# Patient Record
Sex: Female | Born: 1980 | Race: Black or African American | Hispanic: No | Marital: Married | Smoking: Current every day smoker
Health system: Southern US, Community
[De-identification: ages and names within clinical notes are randomized; demographics above are authoritative.]

## PROBLEM LIST (undated history)

## (undated) DIAGNOSIS — J45909 Unspecified asthma, uncomplicated: Secondary | ICD-10-CM

---

## 2019-12-26 ENCOUNTER — Emergency Department (HOSPITAL_COMMUNITY)
Admission: EM | Admit: 2019-12-26 | Discharge: 2019-12-26 | Disposition: A | Discharge status: Home / Self Care | Payer: Medicaid Other | Attending: Emergency Medicine | Admitting: Emergency Medicine

## 2019-12-26 ENCOUNTER — Other Ambulatory Visit: Payer: Self-pay

## 2019-12-26 ENCOUNTER — Emergency Department (HOSPITAL_COMMUNITY): Payer: Medicaid Other

## 2019-12-26 ENCOUNTER — Encounter (HOSPITAL_COMMUNITY): Payer: Self-pay

## 2019-12-26 DIAGNOSIS — W010XXA Fall on same level from slipping, tripping and stumbling without subsequent striking against object, initial encounter: Secondary | ICD-10-CM | POA: Insufficient documentation

## 2019-12-26 DIAGNOSIS — Y9301 Activity, walking, marching and hiking: Secondary | ICD-10-CM | POA: Insufficient documentation

## 2019-12-26 DIAGNOSIS — M25561 Pain in right knee: Secondary | ICD-10-CM | POA: Insufficient documentation

## 2019-12-26 MED ORDER — KETOROLAC TROMETHAMINE 60 MG/2ML IM SOLN
60.0000 mg | Freq: Once | INTRAMUSCULAR | Status: AC
  Administered 2019-12-26: 60 mg via INTRAMUSCULAR
  Filled 2019-12-26: qty 2

## 2019-12-26 NOTE — ED Triage Notes (Signed)
Pt arrived via walk in, c/o right knee pain. Slip and fall onto kitchen floor last night. Denies any other injury.

## 2019-12-26 NOTE — Progress Notes (Signed)
Orthopedic Tech Progress Note Patient Details:  Erika Thomas 1980/05/01 [id number]  Ortho Devices Type of Ortho Device: Knee Sleeve Ortho Device/Splint Interventions: Application   Post Interventions Patient Tolerated: Well Instructions Provided: Care of device   Saul Fordyce 12/26/2019, 9:50 AM

## 2019-12-26 NOTE — ED Provider Notes (Signed)
[facility]-EMERGENCY DEPT Provider Note   CSN: [id number] Arrival date & time: 12/26/19  5366     History Chief Complaint  Patient presents with  . Knee Pain    Erika Thomas is a 39 y.o. female.  HPI 39 year old female with no significant medical history presents to the ER with complaints of right knee pain after a fall.  Patient states that yesterday evening she was walking from one room to another and tripped over a rug and fell onto her right knee.  She states she was able to get up recently, but then slowly developed pain in the right knee.  States that she woke up around 3 AM and has not been able to sleep due to the pain.  States she also feels some pain behind her knee as well.  Denies any numbness or tingling.  Has been able to ambulate but with difficulty.  She has not taken anything for pain management.    History reviewed. No pertinent past medical history.  There are no problems to display for this patient.   History reviewed. No pertinent surgical history.   OB History   No obstetric history on file.     History reviewed. No pertinent family history.  Social History   Tobacco Use  . Smoking status: Never Smoker  . Smokeless tobacco: Never Used  Substance Use Topics  . Alcohol use: Not on file  . Drug use: Not on file    Home Medications Prior to Admission medications   Not on File    Allergies    Patient has no known allergies.  Review of Systems   Review of Systems  Musculoskeletal: Positive for arthralgias and joint swelling.  Neurological: Negative for tremors, weakness and numbness.    Physical Exam Updated Vital Signs BP (!) 119/94 (BP Location: Right Arm)   Pulse 80   Temp 97.7 F (36.5 C) (Oral)   Resp 18   Ht 5\' 5"  (1.651 m)   Wt 81.6 kg   LMP 12/26/2019 (Exact Date)   SpO2 100%   BMI 29.95 kg/m   Physical Exam Vitals and nursing note reviewed.  Constitutional:      General: She is not in acute  distress.    Appearance: She is well-developed.  HENT:     Head: Normocephalic and atraumatic.  Eyes:     Conjunctiva/sclera: Conjunctivae normal.  Cardiovascular:     Rate and Rhythm: Normal rate and regular rhythm.     Heart sounds: No murmur heard.   Pulmonary:     Effort: Pulmonary effort is normal. No respiratory distress.     Breath sounds: Normal breath sounds.  Abdominal:     Palpations: Abdomen is soft.     Tenderness: There is no abdominal tenderness.  Musculoskeletal:        General: Tenderness and signs of injury present. No swelling or deformity.     Cervical back: Neck supple.     Right lower leg: No edema.     Comments: Right circular erythema with visible abrasion to the right tibial tuberosity with tenderness to palpation to the area.  No tibial plateau tenderness.  Full range of motion of right knee.  4/5 strength in flexion of the knee secondary to pain, 5/5 strength with extension.  No joint laxity noted, negative Lachman's.  No notable deformities or significant swelling noted  Skin:    General: Skin is warm and dry.  Neurological:     General: No focal  deficit present.     Mental Status: She is alert and oriented to person, place, and time.     Motor: No weakness.  Psychiatric:        Mood and Affect: Mood normal.        Behavior: Behavior normal.     ED Results / Procedures / Treatments   Labs (all labs ordered are listed, but only abnormal results are displayed) Labs Reviewed - No data to display  EKG None  Radiology DG Knee Complete 4 Views Right  Result Date: 12/26/2019 CLINICAL DATA:  Fall.  Knee pain EXAM: RIGHT KNEE - COMPLETE 4+ VIEW COMPARISON:  None. FINDINGS: No evidence of fracture, dislocation, or joint effusion. No evidence of arthropathy or other focal bone abnormality. Soft tissues are unremarkable. IMPRESSION: No acute abnormality. Electronically Signed   By: Maisie Fus  Register   On: 12/26/2019 09:20    Procedures Procedures  (including critical care time)  Medications Ordered in ED Medications  ketorolac (TORADOL) injection 60 mg (has no administration in time range)    ED Course  I have reviewed the triage vital signs and the nursing notes.  Pertinent labs & imaging results that were available during my care of the patient were reviewed by me and considered in my medical decision making (see chart for details).    MDM Rules/Calculators/A&P                         39 year old female with knee pain after fall.  Patient with a superficial abrasion and erythema to the tibial tuberosity w/ tenderness, no tibial plateau tenderness, low concern for tibial plateau fracture.  Negative Lachman's, no noted joint laxity.  Plain films with  no evidence of fractures.  Patient was informed of the encouraging findings, encouraged her to take Tylenol or ibuprofen for pain which I provided.  Knee sleeve provided.  Encouraged RICE.  Return precautions discussed. PCP followup if no improvement, directed to phone number on discharge paperwork in order to establish with one.  She voiced understanding and is agreeable. Stable for discharge    Final Clinical Impression(s) / ED Diagnoses Final diagnoses:  Acute pain of right knee    Rx / DC Orders ED Discharge Orders    None       Leone Brand 12/26/19 0931    Lorre Nick, MD 12/27/19 [phone]

## 2019-12-26 NOTE — Discharge Instructions (Signed)
Thank you for letting me take care of you in the ER today  Your x-rays did not show any evidence of broken bones in your knee.  It is likely that you have a bone bruise or local irritation due to your fall.  Please make sure to ice area knee or use heat depending on what helps you, wear the knee sleeve, take Tylenol or ibuprofen for pain.  If your symptoms do not improve, please make sure to follow-up with a primary care doctor, if you do not have a primary care doctor, please make sure to call the phone number in your discharge paperwork in order to establish with 1.  Return to the ER for any worsening symptoms.

## 2020-04-28 ENCOUNTER — Ambulatory Visit (HOSPITAL_COMMUNITY): Payer: Self-pay

## 2020-08-02 ENCOUNTER — Emergency Department (HOSPITAL_COMMUNITY): Payer: Self-pay

## 2020-08-02 ENCOUNTER — Other Ambulatory Visit: Payer: Self-pay

## 2020-08-02 ENCOUNTER — Emergency Department (HOSPITAL_COMMUNITY)
Admission: EM | Admit: 2020-08-02 | Discharge: 2020-08-02 | Disposition: A | Discharge status: Home / Self Care | Payer: Self-pay | Attending: Emergency Medicine | Admitting: Emergency Medicine

## 2020-08-02 ENCOUNTER — Encounter (HOSPITAL_COMMUNITY): Payer: Self-pay

## 2020-08-02 DIAGNOSIS — R0782 Intercostal pain: Secondary | ICD-10-CM | POA: Insufficient documentation

## 2020-08-02 DIAGNOSIS — A599 Trichomoniasis, unspecified: Secondary | ICD-10-CM | POA: Insufficient documentation

## 2020-08-02 DIAGNOSIS — R0789 Other chest pain: Secondary | ICD-10-CM

## 2020-08-02 LAB — COMPREHENSIVE METABOLIC PANEL
ALT: 18 U/L (ref 0–44)
AST: 22 U/L (ref 15–41)
Albumin: 4.1 g/dL (ref 3.5–5.0)
Alkaline Phosphatase: 61 U/L (ref 38–126)
Anion gap: 11 (ref 5–15)
BUN: 11 mg/dL (ref 6–20)
CO2: 20 mmol/L — ABNORMAL LOW (ref 22–32)
Calcium: 9.2 mg/dL (ref 8.9–10.3)
Chloride: 106 mmol/L (ref 98–111)
Creatinine, Ser: 0.93 mg/dL (ref 0.44–1.00)
GFR, Estimated: >60 mL/min (ref >60)
Glucose, Bld: 98 mg/dL (ref 70–99)
Potassium: 3.7 mmol/L (ref 3.5–5.1)
Sodium: 137 mmol/L (ref 135–145)
Total Bilirubin: 0.5 mg/dL (ref 0.3–1.2)
Total Protein: 7.6 g/dL (ref 6.5–8.1)

## 2020-08-02 LAB — URINALYSIS, ROUTINE W REFLEX MICROSCOPIC
Bacteria, UA: NONE SEEN
Bilirubin Urine: NEGATIVE
Glucose, UA: NEGATIVE mg/dL
Hgb urine dipstick: NEGATIVE
Ketones, ur: 20 mg/dL — AB
Nitrite: NEGATIVE
Protein, ur: 30 mg/dL — AB
Specific Gravity, Urine: 1.024 (ref 1.005–1.030)
pH: 5 (ref 5.0–8.0)

## 2020-08-02 LAB — CBC
HCT: 36.4 % (ref 36.0–46.0)
Hemoglobin: 11.6 g/dL — ABNORMAL LOW (ref 12.0–15.0)
MCH: 21.4 pg — ABNORMAL LOW (ref 26.0–34.0)
MCHC: 31.9 g/dL (ref 30.0–36.0)
MCV: 67.3 fL — ABNORMAL LOW (ref 80.0–100.0)
Platelets: 428 10*3/uL — ABNORMAL HIGH (ref 150–400)
RBC: 5.41 MIL/uL — ABNORMAL HIGH (ref 3.87–5.11)
RDW: 19.9 % — ABNORMAL HIGH (ref 11.5–15.5)
WBC: 8.6 10*3/uL (ref 4.0–10.5)
nRBC: 0 % (ref 0.0–0.2)

## 2020-08-02 LAB — TROPONIN I (HIGH SENSITIVITY): Troponin I (High Sensitivity): 4 ng/L (ref <18)

## 2020-08-02 LAB — LIPASE, BLOOD: Lipase: 24 U/L (ref 11–51)

## 2020-08-02 LAB — HCG, QUANTITATIVE, PREGNANCY: hCG, Beta Chain, Quant, S: <1 m[IU]/mL (ref <5)

## 2020-08-02 MED ORDER — METHOCARBAMOL 500 MG PO TABS: 500.0000 mg | ORAL_TABLET | Freq: Two times a day (BID) | ORAL | 0 refills | Status: DC

## 2020-08-02 MED ORDER — ACETAMINOPHEN 500 MG PO TABS
1000.0000 mg | ORAL_TABLET | Freq: Once | ORAL | Status: AC
  Administered 2020-08-02: 1000 mg via ORAL
  Filled 2020-08-02: qty 2

## 2020-08-02 MED ORDER — TINIDAZOLE 500 MG PO TABS: 2.0000 g | ORAL_TABLET | Freq: Every day | ORAL | 0 refills | Status: AC

## 2020-08-02 NOTE — ED Notes (Signed)
Pt c/o headache during v/s update and is requesting tylenol. Triage RN notified. Pt was advised that she would have to wait until she sees a doctor.

## 2020-08-02 NOTE — Discharge Instructions (Addendum)
Please take your printed prescriptions to the pharmacy of your choosing. Take the muscle relaxer at nighttime to help with your chest wall pain. DO NOT DRIVE WHILE ON THE MUSCLE RELAXER AS IT CAN MAKE YOU DROWSY. You can take antiinflammatories during the day for same. You can also apply OTC Voltaren gel to your ribs to help with pain. Smoking cessation will also benefit you.    You will need to have all partners tested and treated for trichomonas as well.   Please follow up with [facility] for primary care needs.   Return to the ED for any worsening symptoms

## 2020-08-02 NOTE — ED Notes (Signed)
Pt VS updated and directed to waiting room until room call. Pt sts she is unable to wait any longer, has not slept since yesterday, and has to work. Eloped from waiting area.

## 2020-08-02 NOTE — ED Provider Notes (Signed)
Emergency Medicine Provider Triage Evaluation Note  Erika Thomas , a 40 y.o. female  was evaluated in triage.  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 Pt is a current every day smoker. No nausea, vomiting, diarrhea.  Review of Systems  Positive: + left lateral chest pain Negative: - nausea, vomiting, diarrhea  Physical Exam  BP (!) 130/93 (BP Location: Right Arm)   Pulse 92   Temp 98.3 F (36.8 C) (Oral)   Resp 20   SpO2 100%  Gen:   Awake, no distress   Resp:  Normal effort  MSK:   Moves extremities without difficulty. + bilateral rib TTP Other:    Medical Decision Making  Medically screening exam initiated at 8:34 AM.  Appropriate orders placed.  Erika Thomas was informed that the remainder of the evaluation will be completed by another provider, this initial triage assessment does not replace that evaluation, and the importance of remaining in the ED until their evaluation is complete.     Tanda Rockers, PA-C 08/02/20 5625    Mancel Bale, MD 08/02/20 [phone]

## 2020-08-02 NOTE — ED Provider Notes (Signed)
[facility]-EMERGENCY DEPT Provider Note   CSN: [id number] Arrival date & time: 08/02/20  0046     History Chief Complaint  Patient presents with  . Abdominal Pain    Erika MaserLatonya Menna is a 40 y.o. female who presents to the ED today with complaints of intermittent left sided chest wall pain for the past 6 years.  She reports she is a current every day smoker.  She states that sometimes she will cough and feel a swelling in the left side of her rib cage that is painful.  She states that she has been dealing with it on and off for the past 6 years however yesterday the pain radiated into her right side causing concern.  She takes Tylenol as needed for pain with mild relief.  She has never been evaluated for same.  She denies any abdominal pain, nausea, vomiting, diarrhea, shortness of breath, diaphoresis, leg swelling, any other associated symptoms.  She denies any history of DVT/PE.  No exogenous hormone use.  No recent prolonged travel or immobilization.  No hemoptysis.  No active malignancy.   She would also like to discuss treatment for trichomonas and bacterial vaginosis.  She states that she was diagnosed with this at the [facility] several months ago however was told that she cannot drink while on Flagyl.  She states that she drinks about 2 beers daily to help her sleep.  She never took anything for the trichomonas or BV and continues to have discharge.  She denies any pelvic pain.   The history is provided by the patient and medical records.       History reviewed. No pertinent past medical history.  There are no problems to display for this patient.   History reviewed. No pertinent surgical history.   OB History   No obstetric history on file.     No family history on file.  Social History   Tobacco Use  . Smoking status: Never Smoker  . Smokeless tobacco: Never Used    Home Medications Prior to Admission medications   Medication Sig Start  Date End Date Taking? Authorizing Provider  methocarbamol (ROBAXIN) 500 MG tablet Take 1 tablet (500 mg total) by mouth 2 (two) times daily. 08/02/20  Yes Mihaela Fajardo, PA-C  tinidazole (TINDAMAX) 500 MG tablet Take 4 tablets (2,000 mg total) by mouth daily with breakfast for 7 days. 08/02/20 08/09/20 Yes Artyom Stencel, PA-C    Allergies    Patient has no known allergies.  Review of Systems   Review of Systems  Constitutional: Negative for chills and fever.  Respiratory: Positive for cough (chronic). Negative for shortness of breath.   Cardiovascular: Positive for chest pain. Negative for palpitations and leg swelling.  Gastrointestinal: Negative for abdominal pain, diarrhea, nausea and vomiting.  Genitourinary: Positive for vaginal discharge. Negative for dysuria and vaginal bleeding.  All other systems reviewed and are negative.   Physical Exam Updated Vital Signs BP (!) 130/93 (BP Location: Right Arm)   Pulse 92   Temp 98.3 F (36.8 C) (Oral)   Resp 20   SpO2 100%   Physical Exam Vitals and nursing note reviewed.  Constitutional:      Appearance: She is not ill-appearing or diaphoretic.  HENT:     Head: Normocephalic and atraumatic.  Eyes:     Conjunctiva/sclera: Conjunctivae normal.  Cardiovascular:     Rate and Rhythm: Normal rate and regular rhythm.  Pulmonary:     Effort: Pulmonary effort is normal.  Breath sounds: Normal breath sounds. No wheezing, rhonchi or rales.     Comments: Bilateral rib pain Chest:     Chest wall: Tenderness present.  Abdominal:     General: Abdomen is flat.     Palpations: Abdomen is soft.     Tenderness: There is no abdominal tenderness. There is no guarding or rebound.  Genitourinary:    Comments: Deferred Musculoskeletal:     Cervical back: Neck supple.  Skin:    General: Skin is warm and dry.  Neurological:     Mental Status: She is alert.     ED Results / Procedures / Treatments   Labs (all labs ordered are listed,  but only abnormal results are displayed) Labs Reviewed  COMPREHENSIVE METABOLIC PANEL - Abnormal; Notable for the following components:      Result Value   CO2 20 (*)    All other components within normal limits  CBC - Abnormal; Notable for the following components:   RBC 5.41 (*)    Hemoglobin 11.6 (*)    MCV 67.3 (*)    MCH 21.4 (*)    RDW 19.9 (*)    Platelets 428 (*)    All other components within normal limits  URINALYSIS, ROUTINE W REFLEX MICROSCOPIC - Abnormal; Notable for the following components:   Color, Urine AMBER (*)    APPearance CLOUDY (*)    Ketones, ur 20 (*)    Protein, ur 30 (*)    Leukocytes,Ua MODERATE (*)    All other components within normal limits  LIPASE, BLOOD  HCG, QUANTITATIVE, PREGNANCY  TROPONIN I (HIGH SENSITIVITY)    EKG EKG Interpretation  Date/Time:  Thursday Aug 02 2020 09:21:08 EDT Ventricular Rate:  105 PR Interval:  149 QRS Duration: 83 QT Interval:  318 QTC Calculation: 421 R Axis:   78 Text Interpretation: Sinus tachycardia Right atrial enlargement Borderline T wave abnormalities Baseline wander in lead(s) V4 V6 No old tracing to compare Confirmed by Mancel Bale [phone]) on 08/02/2020 9:44:04 AM   Radiology DG Chest 2 View  Result Date: 08/02/2020 CLINICAL DATA:  Chest pain EXAM: CHEST - 2 VIEW COMPARISON:  None. FINDINGS: Lungs are clear. Heart size and pulmonary vascularity are normal. No adenopathy. No pneumothorax. No bone lesions. Spina bifida occulta noted at T1. IMPRESSION: Lungs clear.  Heart size normal. Electronically Signed   By: Bretta Bang III M.D.   On: 08/02/2020 08:51    Procedures Procedures   Medications Ordered in ED Medications  acetaminophen (TYLENOL) tablet 1,000 mg (1,000 mg Oral Given 08/02/20 1109)    ED Course  I have reviewed the triage vital signs and the nursing notes.  Pertinent labs & imaging results that were available during my care of the patient were reviewed by me and considered in  my medical decision making (see chart for details).    MDM Rules/Calculators/A&P                          40 year old female who presents to the ED today with intermittent left lateral chest wall pain for several years, radiated into her right chest wall yesterday prompting concern.  On arrival to the ED patient is afebrile, nontachycardic, satting 100% on room air.  She was mildly tachycardic initially at 107 however this dissipated while she was in the waiting room.  She had lab work done while in the waiting room including a CBC, CMP, lipase without any acute abnormalities.  Patient's  pain is not located in her abdomen, it is in her right lateral chest.  She is an active every day smoker, states that sometimes she will cough and then feel this pain and bulge in her left side.  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  Symptoms sound very atypical for ACS at this time however given complaint of chest pain will add on a Trope.  She has no risk factors concerning for PE at this time.  If troponin negative and chest x-ray and EKG are unremarkable we will plan to discharge home with outpatient follow-up.  Patient instructed on smoking cessation as I feel this is likely irritating symptoms.  Symptoms consistent with musculoskeletal type pain.   EKG was taken initially on arrival with sinus tachycardia however again this is resolved.  Does have some T wave abnormalities.  Chest x-ray clear. Troponin of 4.  Given her symptoms have been ongoing quite some time I do not feel she needs a repeat troponin.  Again suspect musculoskeletal type pain.  She did aggravate her chest wall lifting yesterday, will prescribe muscle relaxer in the outpatient setting.  On reevaluation patient is requesting medication for trichomonas and bacterial vaginosis.  She states she was diagnosed with that a couple of months ago however never completed the course as she drinks daily.  She is  not interested in quitting drinking.  Discussed case with attending physician Dr. Effie Shy, it does appear that it is recommended that patient refrain from alcohol use with tinnitus I will however disulfiram like reaction lower than Flagyl.  Will discharge home with same at this time  Had planned on initially loading dose of 10 Antizol here in the ED for a one-time treatment of trichomonas however we do not have it in stock.  Will discharge with same.   Urinalysis with moderate leuks, 11-20 white blood cells however no bacteria seen. Negative nitrites.  No urinary symptoms.  No flank tenderness palpation.  Overall reassuring.  Patient to be discharged home at this time.  Provide outpatient follow-up with [facility] for further evaluation.  Will provide muscle relaxer to take at nighttime.  Patient instructed to pick up her medication for trichomonas and have her partner tested and treated as well.  GU exam deferred at this time.   This note was prepared using Dragon voice recognition software and may include unintentional dictation errors due to the inherent limitations of voice recognition software.  Final Clinical Impression(s) / ED Diagnoses Final diagnoses:  Chest wall pain  Trichomonas infection    Rx / DC Orders ED Discharge Orders         Ordered    methocarbamol (ROBAXIN) 500 MG tablet  2 times daily        08/02/20 1254    tinidazole (TINDAMAX) 500 MG tablet  Daily with breakfast        08/02/20 1254           Discharge Instructions     Please take your printed prescriptions to the pharmacy of your choosing. Take the muscle relaxer at nighttime to help with your chest wall pain. DO NOT DRIVE WHILE ON THE MUSCLE RELAXER AS IT CAN MAKE YOU DROWSY. You can take antiinflammatories during the day for same. You can also apply OTC Voltaren gel to your ribs to help with pain. Smoking cessation will also benefit you.    You will need to have all partners tested  and treated  for trichomonas as well.   Please follow up with [facility] for primary care needs.   Return to the ED for any worsening symptoms       Tanda Rockers, PA-C 08/02/20 1257    Mancel Bale, MD 08/02/20 [phone]

## 2020-08-02 NOTE — ED Triage Notes (Signed)
Pt reports bilateral mid side pain. Pt describes it as a "bulging and rubber band feeling that squeezes" when present.

## 2021-01-19 ENCOUNTER — Emergency Department (HOSPITAL_COMMUNITY)
Admission: EM | Admit: 2021-01-19 | Discharge: 2021-01-20 | Discharge status: Against Medical Advice | Payer: Medicaid Other

## 2021-01-19 ENCOUNTER — Other Ambulatory Visit: Payer: Self-pay

## 2021-01-20 ENCOUNTER — Inpatient Hospital Stay (HOSPITAL_COMMUNITY)
Admission: EM | Admit: 2021-01-20 | Discharge: 2021-01-25 | DRG: 193 | Disposition: A | Discharge status: Home / Self Care | Payer: Self-pay | Attending: Family Medicine | Admitting: Family Medicine

## 2021-01-20 ENCOUNTER — Emergency Department (HOSPITAL_COMMUNITY): Payer: Self-pay

## 2021-01-20 ENCOUNTER — Encounter (HOSPITAL_COMMUNITY): Payer: Self-pay | Admitting: Emergency Medicine

## 2021-01-20 DIAGNOSIS — J45901 Unspecified asthma with (acute) exacerbation: Secondary | ICD-10-CM | POA: Diagnosis present

## 2021-01-20 DIAGNOSIS — J1000 Influenza due to other identified influenza virus with unspecified type of pneumonia: Principal | ICD-10-CM | POA: Diagnosis present

## 2021-01-20 DIAGNOSIS — J9621 Acute and chronic respiratory failure with hypoxia: Secondary | ICD-10-CM | POA: Diagnosis present

## 2021-01-20 DIAGNOSIS — T380X5A Adverse effect of glucocorticoids and synthetic analogues, initial encounter: Secondary | ICD-10-CM | POA: Diagnosis present

## 2021-01-20 DIAGNOSIS — J440 Chronic obstructive pulmonary disease with acute lower respiratory infection: Secondary | ICD-10-CM | POA: Diagnosis present

## 2021-01-20 DIAGNOSIS — D509 Iron deficiency anemia, unspecified: Secondary | ICD-10-CM | POA: Diagnosis present

## 2021-01-20 DIAGNOSIS — J101 Influenza due to other identified influenza virus with other respiratory manifestations: Secondary | ICD-10-CM | POA: Diagnosis present

## 2021-01-20 DIAGNOSIS — G9341 Metabolic encephalopathy: Secondary | ICD-10-CM | POA: Diagnosis present

## 2021-01-20 DIAGNOSIS — E871 Hypo-osmolality and hyponatremia: Secondary | ICD-10-CM | POA: Diagnosis present

## 2021-01-20 DIAGNOSIS — J9622 Acute and chronic respiratory failure with hypercapnia: Secondary | ICD-10-CM | POA: Diagnosis present

## 2021-01-20 DIAGNOSIS — J9601 Acute respiratory failure with hypoxia: Secondary | ICD-10-CM

## 2021-01-20 DIAGNOSIS — F17200 Nicotine dependence, unspecified, uncomplicated: Secondary | ICD-10-CM | POA: Diagnosis present

## 2021-01-20 DIAGNOSIS — Z560 Unemployment, unspecified: Secondary | ICD-10-CM

## 2021-01-20 DIAGNOSIS — J111 Influenza due to unidentified influenza virus with other respiratory manifestations: Secondary | ICD-10-CM

## 2021-01-20 DIAGNOSIS — R0602 Shortness of breath: Secondary | ICD-10-CM

## 2021-01-20 DIAGNOSIS — E876 Hypokalemia: Secondary | ICD-10-CM | POA: Diagnosis present

## 2021-01-20 DIAGNOSIS — Z20822 Contact with and (suspected) exposure to covid-19: Secondary | ICD-10-CM | POA: Diagnosis present

## 2021-01-20 DIAGNOSIS — J9602 Acute respiratory failure with hypercapnia: Secondary | ICD-10-CM | POA: Diagnosis present

## 2021-01-20 DIAGNOSIS — E669 Obesity, unspecified: Secondary | ICD-10-CM | POA: Diagnosis present

## 2021-01-20 DIAGNOSIS — F419 Anxiety disorder, unspecified: Secondary | ICD-10-CM | POA: Diagnosis present

## 2021-01-20 DIAGNOSIS — F17210 Nicotine dependence, cigarettes, uncomplicated: Secondary | ICD-10-CM | POA: Diagnosis present

## 2021-01-20 DIAGNOSIS — R739 Hyperglycemia, unspecified: Secondary | ICD-10-CM | POA: Diagnosis present

## 2021-01-20 DIAGNOSIS — Z6830 Body mass index (BMI) 30.0-30.9, adult: Secondary | ICD-10-CM

## 2021-01-20 DIAGNOSIS — D72829 Elevated white blood cell count, unspecified: Secondary | ICD-10-CM | POA: Diagnosis present

## 2021-01-20 DIAGNOSIS — J441 Chronic obstructive pulmonary disease with (acute) exacerbation: Secondary | ICD-10-CM | POA: Diagnosis present

## 2021-01-20 HISTORY — DX: Unspecified asthma, uncomplicated: J45.909

## 2021-01-20 LAB — COMPREHENSIVE METABOLIC PANEL
ALT: 22 U/L (ref 0–44)
AST: 35 U/L (ref 15–41)
Albumin: 4.2 g/dL (ref 3.5–5.0)
Alkaline Phosphatase: 64 U/L (ref 38–126)
Anion gap: 9 (ref 5–15)
BUN: 7 mg/dL (ref 6–20)
CO2: 22 mmol/L (ref 22–32)
Calcium: 8.8 mg/dL — ABNORMAL LOW (ref 8.9–10.3)
Chloride: 102 mmol/L (ref 98–111)
Creatinine, Ser: 0.8 mg/dL (ref 0.44–1.00)
GFR, Estimated: >60 mL/min (ref >60)
Glucose, Bld: 147 mg/dL — ABNORMAL HIGH (ref 70–99)
Potassium: 3.7 mmol/L (ref 3.5–5.1)
Sodium: 133 mmol/L — ABNORMAL LOW (ref 135–145)
Total Bilirubin: 0.5 mg/dL (ref 0.3–1.2)
Total Protein: 8 g/dL (ref 6.5–8.1)

## 2021-01-20 LAB — MRSA NEXT GEN BY PCR, NASAL: MRSA by PCR Next Gen: DETECTED — AB

## 2021-01-20 LAB — CBC WITH DIFFERENTIAL/PLATELET
Abs Immature Granulocytes: 0.1 10*3/uL — ABNORMAL HIGH (ref 0.00–0.07)
Basophils Absolute: 0.1 10*3/uL (ref 0.0–0.1)
Basophils Relative: 0 %
Eosinophils Absolute: 0 10*3/uL (ref 0.0–0.5)
Eosinophils Relative: 0 %
HCT: 35.4 % — ABNORMAL LOW (ref 36.0–46.0)
Hemoglobin: 10.6 g/dL — ABNORMAL LOW (ref 12.0–15.0)
Immature Granulocytes: 1 %
Lymphocytes Relative: 3 %
Lymphs Abs: 0.4 10*3/uL — ABNORMAL LOW (ref 0.7–4.0)
MCH: 20.5 pg — ABNORMAL LOW (ref 26.0–34.0)
MCHC: 29.9 g/dL — ABNORMAL LOW (ref 30.0–36.0)
MCV: 68.3 fL — ABNORMAL LOW (ref 80.0–100.0)
Monocytes Absolute: 0.8 10*3/uL (ref 0.1–1.0)
Monocytes Relative: 6 %
Neutro Abs: 13.1 10*3/uL — ABNORMAL HIGH (ref 1.7–7.7)
Neutrophils Relative %: 90 %
Platelets: 293 10*3/uL (ref 150–400)
RBC: 5.18 MIL/uL — ABNORMAL HIGH (ref 3.87–5.11)
RDW: 18.5 % — ABNORMAL HIGH (ref 11.5–15.5)
WBC: 14.5 10*3/uL — ABNORMAL HIGH (ref 4.0–10.5)
nRBC: 0 % (ref 0.0–0.2)

## 2021-01-20 LAB — TROPONIN I (HIGH SENSITIVITY)
Troponin I (High Sensitivity): 3 ng/L (ref <18)
Troponin I (High Sensitivity): 4 ng/L (ref <18)

## 2021-01-20 LAB — I-STAT BETA HCG BLOOD, ED (MC, WL, AP ONLY): I-stat hCG, quantitative: <5 m[IU]/mL (ref <5)

## 2021-01-20 LAB — RESP PANEL BY RT-PCR (FLU A&B, COVID) ARPGX2
Influenza A by PCR: POSITIVE — AB
Influenza B by PCR: NEGATIVE
SARS Coronavirus 2 by RT PCR: NEGATIVE

## 2021-01-20 MED ORDER — LACTATED RINGERS IV SOLN: INTRAVENOUS | Status: AC

## 2021-01-20 MED ORDER — ENOXAPARIN SODIUM 40 MG/0.4ML IJ SOSY
40.0000 mg | PREFILLED_SYRINGE | INTRAMUSCULAR | Status: DC
  Administered 2021-01-20 – 2021-01-22 (×3): 40 mg via SUBCUTANEOUS
  Filled 2021-01-20 (×3): qty 0.4

## 2021-01-20 MED ORDER — ALBUTEROL SULFATE (2.5 MG/3ML) 0.083% IN NEBU
2.5000 mg | INHALATION_SOLUTION | RESPIRATORY_TRACT | Status: DC | PRN
  Administered 2021-01-20 (×2): 2.5 mg via RESPIRATORY_TRACT
  Filled 2021-01-20 (×2): qty 3

## 2021-01-20 MED ORDER — BENZONATATE 100 MG PO CAPS
200.0000 mg | ORAL_CAPSULE | Freq: Three times a day (TID) | ORAL | Status: DC | PRN
  Administered 2021-01-20 – 2021-01-23 (×5): 200 mg via ORAL
  Filled 2021-01-20 (×5): qty 2

## 2021-01-20 MED ORDER — METHYLPREDNISOLONE SODIUM SUCC 40 MG IJ SOLR
40.0000 mg | Freq: Once | INTRAMUSCULAR | Status: AC
  Administered 2021-01-20: 40 mg via INTRAVENOUS
  Filled 2021-01-20: qty 1

## 2021-01-20 MED ORDER — ALBUTEROL SULFATE (2.5 MG/3ML) 0.083% IN NEBU
5.0000 mg | INHALATION_SOLUTION | Freq: Once | RESPIRATORY_TRACT | Status: DC
  Filled 2021-01-20: qty 6

## 2021-01-20 MED ORDER — IPRATROPIUM-ALBUTEROL 20-100 MCG/ACT IN AERS: 2.0000 | INHALATION_SPRAY | Freq: Four times a day (QID) | RESPIRATORY_TRACT | Status: DC

## 2021-01-20 MED ORDER — ALBUTEROL SULFATE (2.5 MG/3ML) 0.083% IN NEBU
2.5000 mg | INHALATION_SOLUTION | Freq: Once | RESPIRATORY_TRACT | Status: AC
  Administered 2021-01-20: 2.5 mg via RESPIRATORY_TRACT
  Filled 2021-01-20: qty 3

## 2021-01-20 MED ORDER — LACTATED RINGERS IV BOLUS
1000.0000 mL | Freq: Once | INTRAVENOUS | Status: AC
  Administered 2021-01-20: 1000 mL via INTRAVENOUS

## 2021-01-20 MED ORDER — CHLORHEXIDINE GLUCONATE CLOTH 2 % EX PADS
6.0000 | MEDICATED_PAD | Freq: Every day | CUTANEOUS | Status: DC
  Administered 2021-01-21 – 2021-01-24 (×4): 6 via TOPICAL

## 2021-01-20 MED ORDER — MUPIROCIN 2 % EX OINT
1.0000 "application " | TOPICAL_OINTMENT | Freq: Two times a day (BID) | CUTANEOUS | Status: AC
  Administered 2021-01-20 – 2021-01-25 (×10): 1 via NASAL
  Filled 2021-01-20: qty 22

## 2021-01-20 MED ORDER — IPRATROPIUM BROMIDE 0.02 % IN SOLN: 0.5000 mg | Freq: Four times a day (QID) | RESPIRATORY_TRACT | Status: DC

## 2021-01-20 MED ORDER — ALBUTEROL SULFATE (2.5 MG/3ML) 0.083% IN NEBU: 2.5000 mg | INHALATION_SOLUTION | Freq: Four times a day (QID) | RESPIRATORY_TRACT | Status: DC

## 2021-01-20 MED ORDER — PREDNISONE 20 MG PO TABS: 40.0000 mg | ORAL_TABLET | Freq: Every day | ORAL | Status: DC

## 2021-01-20 MED ORDER — SUMATRIPTAN SUCCINATE 50 MG PO TABS
50.0000 mg | ORAL_TABLET | ORAL | Status: AC | PRN
Start: 2021-01-20 — End: 2021-01-22
  Administered 2021-01-20 – 2021-01-22 (×2): 50 mg via ORAL
  Filled 2021-01-20 (×3): qty 1

## 2021-01-20 MED ORDER — POTASSIUM CHLORIDE CRYS ER 20 MEQ PO TBCR
40.0000 meq | EXTENDED_RELEASE_TABLET | Freq: Once | ORAL | Status: AC
  Administered 2021-01-20: 40 meq via ORAL

## 2021-01-20 MED ORDER — LORAZEPAM 2 MG/ML IJ SOLN
0.7500 mg | Freq: Once | INTRAMUSCULAR | Status: AC
  Administered 2021-01-20: 0.75 mg via INTRAVENOUS
  Filled 2021-01-20: qty 1

## 2021-01-20 MED ORDER — IPRATROPIUM-ALBUTEROL 20-100 MCG/ACT IN AERS
1.0000 | INHALATION_SPRAY | Freq: Four times a day (QID) | RESPIRATORY_TRACT | Status: DC
  Administered 2021-01-20: 1 via RESPIRATORY_TRACT
  Filled 2021-01-20: qty 4

## 2021-01-20 MED ORDER — ACETAMINOPHEN 325 MG PO TABS
650.0000 mg | ORAL_TABLET | Freq: Once | ORAL | Status: AC
  Administered 2021-01-20: 650 mg via ORAL
  Filled 2021-01-20: qty 2

## 2021-01-20 MED ORDER — GUAIFENESIN 100 MG/5ML PO LIQD
5.0000 mL | Freq: Once | ORAL | Status: AC
  Administered 2021-01-20: 5 mL via ORAL
  Filled 2021-01-20: qty 10

## 2021-01-20 MED ORDER — IPRATROPIUM-ALBUTEROL 0.5-2.5 (3) MG/3ML IN SOLN
3.0000 mL | Freq: Four times a day (QID) | RESPIRATORY_TRACT | Status: DC
  Administered 2021-01-20 – 2021-01-21 (×3): 3 mL via RESPIRATORY_TRACT
  Filled 2021-01-20 (×3): qty 3

## 2021-01-20 MED ORDER — MAGNESIUM SULFATE 2 GM/50ML IV SOLN
2.0000 g | Freq: Once | INTRAVENOUS | Status: AC
  Administered 2021-01-20: 2 g via INTRAVENOUS
  Filled 2021-01-20: qty 50

## 2021-01-20 MED ORDER — IPRATROPIUM BROMIDE 0.02 % IN SOLN
0.5000 mg | Freq: Once | RESPIRATORY_TRACT | Status: DC
  Filled 2021-01-20: qty 2.5

## 2021-01-20 MED ORDER — METHYLPREDNISOLONE SODIUM SUCC 125 MG IJ SOLR
125.0000 mg | Freq: Once | INTRAMUSCULAR | Status: AC
  Administered 2021-01-20: 125 mg via INTRAVENOUS
  Filled 2021-01-20: qty 2

## 2021-01-20 MED ORDER — ONDANSETRON HCL 4 MG/2ML IJ SOLN: 4.0000 mg | Freq: Four times a day (QID) | INTRAMUSCULAR | Status: DC | PRN

## 2021-01-20 MED ORDER — ACETAMINOPHEN 325 MG PO TABS
650.0000 mg | ORAL_TABLET | Freq: Four times a day (QID) | ORAL | Status: DC | PRN
  Administered 2021-01-20 – 2021-01-22 (×4): 650 mg via ORAL
  Filled 2021-01-20 (×4): qty 2

## 2021-01-20 MED ORDER — IPRATROPIUM-ALBUTEROL 0.5-2.5 (3) MG/3ML IN SOLN
3.0000 mL | Freq: Once | RESPIRATORY_TRACT | Status: AC
  Administered 2021-01-20: 3 mL via RESPIRATORY_TRACT
  Filled 2021-01-20: qty 3

## 2021-01-20 MED ORDER — BENZONATATE 100 MG PO CAPS
100.0000 mg | ORAL_CAPSULE | Freq: Once | ORAL | Status: AC
  Administered 2021-01-20: 100 mg via ORAL
  Filled 2021-01-20: qty 1

## 2021-01-20 MED ORDER — ORAL CARE MOUTH RINSE
15.0000 mL | Freq: Two times a day (BID) | OROMUCOSAL | Status: DC
  Administered 2021-01-20 – 2021-01-22 (×3): 15 mL via OROMUCOSAL

## 2021-01-20 MED ORDER — ONDANSETRON HCL 4 MG PO TABS: 4.0000 mg | ORAL_TABLET | Freq: Four times a day (QID) | ORAL | Status: DC | PRN

## 2021-01-20 MED ORDER — OSELTAMIVIR PHOSPHATE 75 MG PO CAPS
75.0000 mg | ORAL_CAPSULE | Freq: Two times a day (BID) | ORAL | Status: AC
  Administered 2021-01-20 – 2021-01-24 (×9): 75 mg via ORAL
  Filled 2021-01-20 (×10): qty 1

## 2021-01-20 MED ORDER — IPRATROPIUM-ALBUTEROL 0.5-2.5 (3) MG/3ML IN SOLN: 3.0000 mL | Freq: Four times a day (QID) | RESPIRATORY_TRACT | Status: DC

## 2021-01-20 NOTE — H&P (Signed)
History and Physical    Erika Thomas WCH:[id number] DOB: Aug 21, 1980 DOA: 01/20/2021  PCP: Pcp, No   Patient coming from: Home.  I have personally briefly reviewed patient's old medical records in [facility]  Chief Complaint: SOB.  HPI: Erika Thomas is a 40 y.o. female with medical history significant of for asthma, overweight, who is coming to the emergency department due to progressively worsening dyspnea associated with wheezing, cough with right lower chest wall pain after she was exposed to her daughter that had a similar respiratory illness earlier in the week.  No rhinorrhea, mild sore throat, no fever, positive chills, no hemoptysis, chest pressure, diaphoresis, PND, orthopnea or pitting edema of the lower extremities.  The patient has been having some palpitations and feeling anxious after all the albuterol nebulizers.  She denied abdominal pain, nausea, emesis, diarrhea, constipation, melena or hematochezia.  No dysuria, frequency or hematuria.  No polyuria, polydipsia, polyphagia or blurred vision.  ED Course: Initial vital signs were temperature 98.6 F, pulse 140, respirations 16, BP 169/98 mmHg O2 sat 96% on room air.  The patient received supplemental oxygen, magnesium sulfate, multiple nebulizer treatment and Solu-Medrol.  Lab work: CBC showed a white count 14.5, hemoglobin 10.6 g/dL platelets 353.  CMP showed a glucose of 147 and calcium of 8.8 mg/dL.  The rest of the CMP results are normal.  Review of Systems: As per HPI otherwise all other systems reviewed and are negative.  Past Medical History:  Diagnosis Date   Asthma    History reviewed. No pertinent surgical history.  Social History  reports that she has never smoked. She has never used smokeless tobacco. She reports that she does not currently use alcohol. She reports that she does not use drugs.  No Known Allergies  Family History  Problem Relation Age of Onset   Hypertension Other    Prior to  Admission medications   Medication Sig Start Date End Date Taking? Authorizing Provider  Acetaminophen (TYLENOL EXTRA STRENGTH PO) Take 1,300 mg by mouth every 8 (eight) hours as needed (headache).   Yes [provider]  methocarbamol (ROBAXIN) 500 MG tablet Take 1 tablet (500 mg total) by mouth 2 (two) times daily. Patient not taking: Reported on 01/20/2021 08/02/20   Tanda Rockers, PA-C    Physical Exam: Vitals:   01/20/21 1422 01/20/21 1445 01/20/21 1500 01/20/21 1653  BP: (!) 130/96 (!) 131/95 (!) 115/100 125/84  Pulse: (!) 133 (!) 130 (!) 123 (!) 124  Resp: (!) 24 (!) 28 (!) 38 (!) 26  Temp:    98 F (36.7 C)  TempSrc:    Oral  SpO2: 92% 94% 93% 95%    Constitutional: NAD, calm, comfortable Eyes: PERRL, lids and conjunctivae normal ENMT: Mucous membranes are moist. Posterior pharynx clear of any exudate or lesions. Neck: normal, supple, no masses, no thyromegaly Respiratory: Mildly tachypneic in the low 20s with decreased breath sounds with mild bilateral wheezing, no crackles.  No accessory muscle use.  Cardiovascular: Sinus tachycardia in the 110s., no murmurs / rubs / gallops. No extremity edema. 2+ pedal pulses. No carotid bruits.  Abdomen: Obese, no distention.  Bowel sounds positive.  Soft, no tenderness, no masses palpated. No hepatosplenomegaly. Musculoskeletal: no clubbing / cyanosis. Good ROM, no contractures. Normal muscle tone.  Skin: no rashes, lesions, ulcers on limited dermatological examination. Neurologic: CN 2-12 grossly intact. Sensation intact, DTR normal. Strength 5/5 in all 4.  Psychiatric: Normal judgment and insight. Alert and oriented x 3.  Anxious mood.  Labs on Admission: I have personally reviewed following labs and imaging studies  CBC: Recent Labs  Lab 01/20/21 1034  WBC 14.5*  NEUTROABS 13.1*  HGB 10.6*  HCT 35.4*  MCV 68.3*  PLT 293    Basic Metabolic Panel: Recent Labs  Lab 01/20/21 1034  NA 133*  K 3.7  CL 102  CO2 22   GLUCOSE 147*  BUN 7  CREATININE 0.80  CALCIUM 8.8*    GFR: CrCl cannot be calculated (Unknown ideal weight.).  Liver Function Tests: Recent Labs  Lab 01/20/21 1034  AST 35  ALT 22  ALKPHOS 64  BILITOT 0.5  PROT 8.0  ALBUMIN 4.2   Radiological Exams on Admission: DG Chest Port 1 View  Result Date: 01/20/2021 CLINICAL DATA:  Shortness of breath for 2 days in a 40 year old female. EXAM: PORTABLE CHEST 1 VIEW COMPARISON:  Aug 02, 2020. FINDINGS: EKG leads project over the chest. Trachea midline. Cardiomediastinal contours and hilar structures are normal. Lungs are clear. No sign of effusion on frontal radiograph. On limited assessment there is no acute skeletal process. IMPRESSION: No acute cardiopulmonary disease. Electronically Signed   By: Donzetta Kohut M.D.   On: 01/20/2021 10:18    EKG: Independently reviewed.  Vent. rate 132 BPM PR interval 154 ms QRS duration 65 ms QT/QTcB 331/491 ms P-R-T axes 79 77 * Sinus tachycardia LAE, consider biatrial enlargement Anteroseptal infarct, old Nonspecific T abnormalities, lateral leads  Assessment/Plan Principal Problem:   Acute on chronic respiratory failure with hypoxia (HCC) In the setting of   Asthma exacerbation Due to   Influenza A Chest x-ray was clear. Observation/stepdown. Continue supplemental oxygen. Continue bronchodilators. Continue methylprednisolone. Prednisone taper in AM. Tamiflu 75 mg p.o. twice daily. BiPAP as needed.    Hyperglycemia Recheck glucose fasting.    Hypocalcemia Recheck calcium level in AM.   DVT prophylaxis: Lovenox SQ. Code Status:   Full code. Family Communication:   Disposition Plan:   Patient is from:  Home.  Anticipated DC to:  Home.  Anticipated DC date:  01/22/2021.  Anticipated DC barriers: Clinical status. Consults called:   Admission status:  Observation/stepdown.  Severity of Illness:  High severity after presenting with acute respiratory failure with hypoxia  due to asthma exacerbation in the setting of influenza A bronchitis.    Bobette Mo MD [facility]  How to contact the [facility] Attending or Consulting provider 7A - 7P or covering provider during after hours 7P -7A, for this patient?   Check the care team in [facility] and look for a) attending/consulting [facility] provider listed and b) the [facility] team listed Log into [email or web address] and use [place]'s universal password to access. If you do not have the password, please contact the hospital operator. Locate the [facility] provider you are looking for under [facility] and page to a number that you can be directly reached. If you still have difficulty reaching the provider, please page the [facility] (Director on Call) for the Hospitalists listed on amion for assistance.  01/20/2021, 5:42 PM   This document was prepared using Dragon voice recognition software and may contain some unintended transcription errors.

## 2021-01-20 NOTE — ED Triage Notes (Signed)
Patient  here from home reporting SOB x2 days. Hx of asthma. Cough. Denies home testing for Covid.

## 2021-01-20 NOTE — ED Provider Notes (Signed)
[facility]-EMERGENCY DEPT Provider Note   CSN: [id number] Arrival date & time: 01/20/21  0846     History Chief Complaint  Patient presents with   Shortness of Breath    Erika Thomas is a 40 y.o. female.  With past medical history of asthma and tobacco use who presents to the emergency department with shortness of breath.  She states she has had increasing shortness of breath since yesterday.  Describes it as constant.  She states that she has asthma and uses an albuterol inhaler however that has been helpful at home.  Endorses some mild lightheadedness and breathlessness.  Feels moderately anxious.  She denies any fevers or productive cough, sore throat.  She says she has chronic cough from tobacco use.  She states that her daughter has been sick over the past few days but is unsure of what she has.  She denies chest pain, lower extremity edema, abdominal pain.  Shortness of Breath Associated symptoms: cough and wheezing   Associated symptoms: no abdominal pain, no chest pain, no fever and no sore throat       Past Medical History:  Diagnosis Date   Asthma     There are no problems to display for this patient.   History reviewed. No pertinent surgical history.   OB History   No obstetric history on file.     No family history on file.  Social History   Tobacco Use   Smoking status: Never   Smokeless tobacco: Never  Substance Use Topics   Alcohol use: Not Currently   Drug use: Never    Home Medications Prior to Admission medications   Medication Sig Start Date End Date Taking? Authorizing Provider  methocarbamol (ROBAXIN) 500 MG tablet Take 1 tablet (500 mg total) by mouth 2 (two) times daily. 08/02/20   Tanda Rockers, PA-C    Allergies    Patient has no known allergies.  Review of Systems   Review of Systems  Constitutional:  Negative for fever.  HENT:  Negative for sore throat.   Respiratory:  Positive for cough, chest  tightness, shortness of breath and wheezing.   Cardiovascular:  Negative for chest pain, palpitations and leg swelling.  Gastrointestinal:  Negative for abdominal pain.  Neurological:  Positive for light-headedness.  All other systems reviewed and are negative.  Physical Exam Updated Vital Signs BP (!) 142/83   Pulse (!) 135   Temp 98.6 F (37 C) (Oral)   Resp (!) 24   SpO2 100%   Physical Exam Vitals and nursing note reviewed.  Constitutional:      Appearance: Normal appearance. She is well-developed. She is ill-appearing. She is not toxic-appearing.  HENT:     Head: Normocephalic and atraumatic.     Nose: Nose normal.     Mouth/Throat:     Mouth: Mucous membranes are dry.     Pharynx: Oropharynx is clear.  Eyes:     General: No scleral icterus.    Pupils: Pupils are equal, round, and reactive to light.  Cardiovascular:     Rate and Rhythm: Regular rhythm. Tachycardia present.     Pulses: Normal pulses.     Heart sounds: No murmur heard. Pulmonary:     Effort: Tachypnea and accessory muscle usage present.     Breath sounds: Examination of the left-upper field reveals wheezing. Examination of the left-middle field reveals wheezing. Examination of the right-lower field reveals decreased breath sounds and wheezing. Examination of the left-lower field reveals  decreased breath sounds and wheezing. Decreased breath sounds and wheezing present.  Chest:     Chest wall: No tenderness.  Abdominal:     General: Bowel sounds are normal.     Palpations: Abdomen is soft.     Tenderness: There is no abdominal tenderness.  Musculoskeletal:        General: Normal range of motion.     Cervical back: Normal range of motion and neck supple.     Right lower leg: No edema.     Left lower leg: No edema.  Skin:    General: Skin is warm and dry.     Capillary Refill: Capillary refill takes less than 2 seconds.  Neurological:     General: No focal deficit present.     Mental Status: She is  alert and oriented to person, place, and time.  Psychiatric:        Mood and Affect: Mood is anxious.        Behavior: Behavior normal.   ED Results / Procedures / Treatments   Labs (all labs ordered are listed, but only abnormal results are displayed) Labs Reviewed  RESP PANEL BY RT-PCR (FLU A&B, COVID) ARPGX2 - Abnormal; Notable for the following components:      Result Value   Influenza A by PCR POSITIVE (*)    All other components within normal limits  COMPREHENSIVE METABOLIC PANEL - Abnormal; Notable for the following components:   Sodium 133 (*)    Glucose, Bld 147 (*)    Calcium 8.8 (*)    All other components within normal limits  CBC WITH DIFFERENTIAL/PLATELET  I-STAT BETA HCG BLOOD, ED ([facility], [facility], [facility] ONLY)  TROPONIN I (HIGH SENSITIVITY)  TROPONIN I (HIGH SENSITIVITY)   EKG None Sinus tachycardia   Radiology DG Chest Port 1 View  Result Date: 01/20/2021 CLINICAL DATA:  Shortness of breath for 2 days in a 40 year old female. EXAM: PORTABLE CHEST 1 VIEW COMPARISON:  Aug 02, 2020. FINDINGS: EKG leads project over the chest. Trachea midline. Cardiomediastinal contours and hilar structures are normal. Lungs are clear. No sign of effusion on frontal radiograph. On limited assessment there is no acute skeletal process. IMPRESSION: No acute cardiopulmonary disease. Electronically Signed   By: Donzetta Kohut M.D.   On: 01/20/2021 10:18    Procedures .Critical Care Performed by: Cristopher Peru, PA-C Authorized by: Cristopher Peru, PA-C   Critical care provider statement:    Critical care time (minutes):  40   Critical care time was exclusive of:  Separately billable procedures and treating other patients and teaching time   Critical care was necessary to treat or prevent imminent or life-threatening deterioration of the following conditions:  Respiratory failure   Critical care was time spent personally by me on the following activities:  Development of treatment plan with  patient or surrogate, discussions with consultants, evaluation of patient's response to treatment, examination of patient, obtaining history from patient or surrogate, review of old charts, re-evaluation of patient's condition, pulse oximetry, ordering and review of radiographic studies, ordering and review of laboratory studies and ordering and performing treatments and interventions   I assumed direction of critical care for this patient from another provider in my specialty: no     Care discussed with: admitting provider     Medications Ordered in ED Medications  Ipratropium-Albuterol (COMBIVENT) respimat 1 puff (has no administration in time range)  guaiFENesin (ROBITUSSIN) 100 MG/5ML liquid 5 mL (has no administration in time range)  acetaminophen (TYLENOL) tablet 650 mg (650 mg Oral Given 01/20/21 1027)  methylPREDNISolone sodium succinate (SOLU-MEDROL) 125 mg/2 mL injection 125 mg (125 mg Intravenous Given 01/20/21 1024)  magnesium sulfate IVPB 2 g 50 mL (0 g Intravenous Stopped 01/20/21 1230)  benzonatate (TESSALON) capsule 100 mg (100 mg Oral Given 01/20/21 1342)  ipratropium-albuterol (DUONEB) 0.5-2.5 (3) MG/3ML nebulizer solution 3 mL (3 mLs Nebulization Given 01/20/21 1231)  albuterol (PROVENTIL) (2.5 MG/3ML) 0.083% nebulizer solution 2.5 mg (2.5 mg Nebulization Given 01/20/21 1241)  albuterol (PROVENTIL) (2.5 MG/3ML) 0.083% nebulizer solution 2.5 mg (2.5 mg Nebulization Given 01/20/21 1241)   ED Course  I have reviewed the triage vital signs and the nursing notes.  Pertinent labs & imaging results that were available during my care of the patient were reviewed by me and considered in my medical decision making (see chart for details).  1215: I personally went to reassess patient and her O2 sat on RA was 84%. I placed her on 4L with improvement to 93%. Called respiratory who states she received combivent but not albuterol treatments because they were pending COVID results.  1330:  re-evaluated after x3 albuterol nebulizers. Patient still symptomatic with shortness of breath. Continues to have wheezing bilaterally. Able to decrease O2 from 4-->2L with maintaining O2 sat. Will reassess shortly. If unable to wean off O2 will need admission.  MDM Rules/Calculators/A&P 40 year old female who presents to the emergency department with shortness of breath.  Presents with dyspnea most likely secondary to influenza A infection. Her presentation is not consistent with cardiac etiology including ACS. EKG without ischemia or infarction, troponin negative. Considered acute PE however, her presentation is not consistent with PE and she is positive for influenza A.  CXR without overt pneumonia, no pneumothorax.   She does have history of asthma and tobacco use, making her higher risk for more severe sequelae of influenza infection. She has increased WOB on exam. Wheezing in all lung fields. She was given continuous nebulized albuterol, methylprednisolone IV, and IV magnesium with moderate improvement in wheezing, however patient continues to have shortness of breath. As noted above, I reassessed the patient early in her ED course. She had decreased O2 sat to 84% on room air. She was placed on 4L [place] and given further albuterol treatments. On reassessment, I am able to titrate her down to 2L, however, I attempt placing patient on RA with O2 sat to 90%. Because of her continuous new oxygen requirement, increased RR in the setting of known infection, will admit her for ongoing management.   Spoke with Dr. Robb Matar, hospitalist at 1457 who agrees to admit patient.   I discussed this case with my attending physician who cosigned this note including patient's presenting symptoms, physical exam, and planned diagnostics and interventions. Attending physician stated agreement with plan or made changes to plan which were implemented.   Attending physician assessed patient at bedside.  Final Clinical  Impression(s) / ED Diagnoses Final diagnoses:  SOB (shortness of breath)  Influenza    Rx / DC Orders ED Discharge Orders     None        Cristopher Peru, PA-C 01/20/21 1459    Cathren Laine, MD 01/22/21 1625

## 2021-01-21 ENCOUNTER — Other Ambulatory Visit: Payer: Self-pay

## 2021-01-21 ENCOUNTER — Observation Stay (HOSPITAL_COMMUNITY): Payer: Self-pay

## 2021-01-21 ENCOUNTER — Encounter (HOSPITAL_COMMUNITY): Payer: Self-pay | Admitting: Internal Medicine

## 2021-01-21 DIAGNOSIS — J4541 Moderate persistent asthma with (acute) exacerbation: Secondary | ICD-10-CM

## 2021-01-21 DIAGNOSIS — J9601 Acute respiratory failure with hypoxia: Secondary | ICD-10-CM

## 2021-01-21 DIAGNOSIS — J101 Influenza due to other identified influenza virus with other respiratory manifestations: Secondary | ICD-10-CM

## 2021-01-21 DIAGNOSIS — J9602 Acute respiratory failure with hypercapnia: Secondary | ICD-10-CM

## 2021-01-21 DIAGNOSIS — J9621 Acute and chronic respiratory failure with hypoxia: Secondary | ICD-10-CM

## 2021-01-21 LAB — FERRITIN: Ferritin: 19 ng/mL (ref 11–307)

## 2021-01-21 LAB — BLOOD GAS, ARTERIAL
Acid-Base Excess: 0.5 mmol/L (ref 0.0–2.0)
Acid-Base Excess: 1.5 mmol/L (ref 0.0–2.0)
Acid-base deficit: 2.2 mmol/L — ABNORMAL HIGH (ref 0.0–2.0)
Bicarbonate: 26.2 mmol/L (ref 20.0–28.0)
Bicarbonate: 29.1 mmol/L — ABNORMAL HIGH (ref 20.0–28.0)
Bicarbonate: 28.6 mmol/L — ABNORMAL HIGH (ref 20.0–28.0)
Delivery systems: POSITIVE
Delivery systems: POSITIVE
Drawn by: 560031
Drawn by: 560031
Expiratory PAP: 5
Expiratory PAP: 5
FIO2: 35
FIO2: 35
Inspiratory PAP: 16
Inspiratory PAP: 14
Mode: POSITIVE
Mode: POSITIVE
O2 Saturation: 97.7 %
O2 Saturation: 99.5 %
O2 Saturation: 96.4 %
Patient temperature: 98
Patient temperature: 98.6
Patient temperature: 98.6
pCO2 arterial: 67.8 mmHg (ref 32.0–48.0)
pCO2 arterial: 74.3 mmHg (ref 32.0–48.0)
pCO2 arterial: 63.5 mmHg — ABNORMAL HIGH (ref 32.0–48.0)
pH, Arterial: 7.21 — ABNORMAL LOW (ref 7.350–7.450)
pH, Arterial: 7.217 — ABNORMAL LOW (ref 7.350–7.450)
pH, Arterial: 7.276 — ABNORMAL LOW (ref 7.350–7.450)
pO2, Arterial: 110 mmHg — ABNORMAL HIGH (ref 83.0–108.0)
pO2, Arterial: 179 mmHg — ABNORMAL HIGH (ref 83.0–108.0)
pO2, Arterial: 88.9 mmHg (ref 83.0–108.0)

## 2021-01-21 LAB — CBC
HCT: 34.7 % — ABNORMAL LOW (ref 36.0–46.0)
Hemoglobin: 10.3 g/dL — ABNORMAL LOW (ref 12.0–15.0)
MCH: 20.5 pg — ABNORMAL LOW (ref 26.0–34.0)
MCHC: 29.7 g/dL — ABNORMAL LOW (ref 30.0–36.0)
MCV: 69.1 fL — ABNORMAL LOW (ref 80.0–100.0)
Platelets: 343 10*3/uL (ref 150–400)
RBC: 5.02 MIL/uL (ref 3.87–5.11)
RDW: 18.4 % — ABNORMAL HIGH (ref 11.5–15.5)
WBC: 17.3 10*3/uL — ABNORMAL HIGH (ref 4.0–10.5)
nRBC: 0 % (ref 0.0–0.2)

## 2021-01-21 LAB — PROCALCITONIN: Procalcitonin: <0.1 ng/mL

## 2021-01-21 LAB — BASIC METABOLIC PANEL
Anion gap: 5 (ref 5–15)
BUN: 6 mg/dL (ref 6–20)
CO2: 28 mmol/L (ref 22–32)
Calcium: 9.1 mg/dL (ref 8.9–10.3)
Chloride: 101 mmol/L (ref 98–111)
Creatinine, Ser: 0.67 mg/dL (ref 0.44–1.00)
GFR, Estimated: >60 mL/min (ref >60)
Glucose, Bld: 111 mg/dL — ABNORMAL HIGH (ref 70–99)
Potassium: 5.1 mmol/L (ref 3.5–5.1)
Sodium: 134 mmol/L — ABNORMAL LOW (ref 135–145)

## 2021-01-21 LAB — IRON AND TIBC
Iron: 20 ug/dL — ABNORMAL LOW (ref 28–170)
Saturation Ratios: 4 % — ABNORMAL LOW (ref 10.4–31.8)
TIBC: 537 ug/dL — ABNORMAL HIGH (ref 250–450)
UIBC: 517 ug/dL

## 2021-01-21 LAB — HIV ANTIBODY (ROUTINE TESTING W REFLEX): HIV Screen 4th Generation wRfx: NONREACTIVE

## 2021-01-21 LAB — HEMOGLOBIN A1C
Hgb A1c MFr Bld: 5.3 % (ref 4.8–5.6)
Mean Plasma Glucose: 105.41 mg/dL

## 2021-01-21 MED ORDER — LEVALBUTEROL HCL 0.63 MG/3ML IN NEBU: 0.6300 mg | INHALATION_SOLUTION | Freq: Four times a day (QID) | RESPIRATORY_TRACT | Status: DC | PRN

## 2021-01-21 MED ORDER — IPRATROPIUM-ALBUTEROL 0.5-2.5 (3) MG/3ML IN SOLN
3.0000 mL | RESPIRATORY_TRACT | Status: DC
  Administered 2021-01-21 – 2021-01-23 (×12): 3 mL via RESPIRATORY_TRACT
  Filled 2021-01-21 (×12): qty 3

## 2021-01-21 MED ORDER — METHYLPREDNISOLONE SODIUM SUCC 125 MG IJ SOLR
60.0000 mg | Freq: Two times a day (BID) | INTRAMUSCULAR | Status: DC
  Administered 2021-01-21: 60 mg via INTRAVENOUS
  Filled 2021-01-21: qty 2

## 2021-01-21 MED ORDER — SODIUM CHLORIDE 0.9 % IV SOLN
500.0000 mg | INTRAVENOUS | Status: DC
  Administered 2021-01-21 – 2021-01-22 (×2): 500 mg via INTRAVENOUS
  Filled 2021-01-21 (×2): qty 500

## 2021-01-21 MED ORDER — SODIUM CHLORIDE 0.9 % IV SOLN
2.0000 g | INTRAVENOUS | Status: DC
  Administered 2021-01-21 – 2021-01-22 (×2): 2 g via INTRAVENOUS
  Filled 2021-01-21 (×2): qty 20

## 2021-01-21 MED ORDER — LACTATED RINGERS IV SOLN: INTRAVENOUS | Status: DC

## 2021-01-21 MED ORDER — DEXMEDETOMIDINE HCL IN NACL 200 MCG/50ML IV SOLN
0.4000 ug/kg/h | INTRAVENOUS | Status: AC
  Administered 2021-01-21: 0.4 ug/kg/h via INTRAVENOUS
  Administered 2021-01-21: 0.5 ug/kg/h via INTRAVENOUS
  Administered 2021-01-21: 0.6 ug/kg/h via INTRAVENOUS
  Filled 2021-01-21 (×3): qty 50

## 2021-01-21 MED ORDER — VANCOMYCIN HCL 750 MG/150ML IV SOLN
750.0000 mg | Freq: Two times a day (BID) | INTRAVENOUS | Status: DC
  Administered 2021-01-21 – 2021-01-22 (×2): 750 mg via INTRAVENOUS
  Filled 2021-01-21 (×3): qty 150

## 2021-01-21 MED ORDER — ALBUTEROL SULFATE (2.5 MG/3ML) 0.083% IN NEBU
10.0000 mg/h | INHALATION_SOLUTION | RESPIRATORY_TRACT | Status: AC
  Administered 2021-01-21: 10 mg/h via RESPIRATORY_TRACT
  Filled 2021-01-21: qty 12

## 2021-01-21 MED ORDER — VANCOMYCIN HCL 1500 MG/300ML IV SOLN
1500.0000 mg | Freq: Once | INTRAVENOUS | Status: AC
  Administered 2021-01-21: 1500 mg via INTRAVENOUS
  Filled 2021-01-21 (×2): qty 300

## 2021-01-21 MED ORDER — METHYLPREDNISOLONE SODIUM SUCC 125 MG IJ SOLR
60.0000 mg | Freq: Four times a day (QID) | INTRAMUSCULAR | Status: DC
  Administered 2021-01-21 – 2021-01-23 (×7): 60 mg via INTRAVENOUS
  Filled 2021-01-21 (×7): qty 2

## 2021-01-21 MED ORDER — LORAZEPAM 2 MG/ML IJ SOLN
0.7500 mg | Freq: Once | INTRAMUSCULAR | Status: AC
  Administered 2021-01-21: 0.75 mg via INTRAVENOUS
  Filled 2021-01-21: qty 1

## 2021-01-21 MED ORDER — DEXMEDETOMIDINE HCL IN NACL 200 MCG/50ML IV SOLN
0.4000 ug/kg/h | INTRAVENOUS | Status: DC
  Administered 2021-01-21: 0.2 ug/kg/h via INTRAVENOUS
  Filled 2021-01-21: qty 50

## 2021-01-21 MED ORDER — ALBUTEROL SULFATE (2.5 MG/3ML) 0.083% IN NEBU: 2.5000 mg | INHALATION_SOLUTION | RESPIRATORY_TRACT | Status: DC | PRN

## 2021-01-21 MED ORDER — CHLORHEXIDINE GLUCONATE 0.12 % MT SOLN
15.0000 mL | Freq: Two times a day (BID) | OROMUCOSAL | Status: DC
  Administered 2021-01-22 – 2021-01-24 (×5): 15 mL via OROMUCOSAL
  Filled 2021-01-21 (×7): qty 15

## 2021-01-21 NOTE — Progress Notes (Signed)
1932: Pt reporting feelings of restlessness and anxiety related to SOB. RR:32 02 sat:100. Blount, NP notified, orders given for ativan 0.75mg  IV. Pt reported feelings of relief after administration but continued to have brief episodes of restlessness. Pt refusing BiPap at this time.   0106: Pt attempted to get OOB and was redirected. Pt reported feelings of anxiety and SOB. Pt appeared to be in distress and was using accessory muscles while breathing, RR: 33, 02: 100. Blount, NP notified. Orders placed for STAT ABG, 0.75mg  IV ativan, and BiPap. Pt resting on BiPap after ativan administration. ABG showed CO2 retention, respiratory acidosis, and hyper-oxygenation; results reported to provider.   6629: Pt attempted to remove BiPap mask and upon entering room she was adamant that it be removed. BiPap removed and pt stated that she "could not breath" with it on. Pt mentation noted to be worsening at this time, showing signs of delirium with episodes of lethargy combined with periods of restlessness/agitation. Attempted to educate pt on importance of wearing BiPap to vent CO2 and avoid intubation. Pt still refusing. Blount, NP notified and came to bedside to assess. Orders for Precedex gtt placed in order to help pt better tolerate BiPap. Pt now resting comfortably on BiPap, will continue to monitor. VSS.

## 2021-01-21 NOTE — Progress Notes (Signed)
Pts order for BIPAP is PRN. Pt respiratory status stable at this time on [place] 2 Lpm w/no distress noted at this time. RT will continue to monitor.

## 2021-01-21 NOTE — Consult Note (Addendum)
NAME:  Erika Thomas, MRN:  [id number], DOB:  [birth year], LOS: 0 ADMISSION DATE:  01/20/2021 CONSULTATION DATE:  01/21/2021 REFERRING MD:  Waymon Amato - [facility] CHIEF COMPLAINT:  Dyspnea/SOB, Influenza A   History of Present Illness:  40 year old woman who presented to [facility] ED 10/23 for 2-day history of SOB. PMHx significant for asthma vs. COPD and tobacco use. Per patient's mother (at bedside), patient has been coughing for "weeks" with worsening over the last 2-3 days. Tested positive for Flu A. Of note, patient's daughter has recently been ill from a respiratory illness  In the ED, patient was dyspneic, tachypneic and hypoxic to 84% on RA. O2 sats improved on 4L [place]. IV Solumedrol, IV mag and albuterol nebs were administered with improvement. Flu A+ and Tamiflu initiated. Given persistent O2 requirement, she was ultimately admitted.  On 10/24AM, patient was noted to be progressively dyspneic/tachypneic with O2 desaturation. BiPAP was initiated. PCCM was consulted for further evaluation and management.  Pertinent Medical History:  Asthma vs. COPD, history of tobacco and marijuana use  Significant Hospital Events: Including procedures, antibiotic start and stop dates in addition to other pertinent events   10/23 - Admitted to [facility]) for worsening dyspnea, Flu A + 10/24 - Increasing tachypnea, dyspnea, SOB. Placed on BiPAP. Precedex for agitation. PCCM consulted.  Interim History / Subjective:  PCCM consulted for worsening dyspnea, tachypnea and hypoxia  Objective:  Blood pressure 118/80, pulse (!) 102, temperature 97.7 F (36.5 C), temperature source Oral, resp. rate (!) 37, height 5\' 5"  (1.651 m), weight 83.6 kg, SpO2 94 %.    FiO2 (%):  [32 %-35 %] 35 %   Intake/Output Summary (Last 24 hours) at 01/21/2021 0952 Last data filed at 01/21/2021 0451 Gross per 24 hour  Intake 1060.73 ml  Output 800 ml  Net 260.73 ml   Filed Weights   01/21/21 0327  Weight: 83.6 kg   Physical  Examination: General: Acutely ill-appearing woman in mild respiratory distress. HEENT: [place]/AT, anicteric sclera, PERRL, dry mucous membranes. Neuro: Lethargic. Responds to verbal stimuli. Following commands intermittently. Moves all 4 extremities spontaneously. CV: RRR, no m/g/r. PULM: Tachypneic with increased WOB on BiPAP (14/6, FiO2 40%). Scattered rhonchi near bases. Poor air movement throughout. +Paradoxical breathing with accessory muscle use. GI: Soft, nontender, nondistended. Normoactive bowel sounds. Extremities: No LE edema noted. Skin: Warm/dry, no rashes.  Resolved Hospital Problem List     Assessment & Plan:  Acute hypoxemic and hypercapnic respiratory failure - Recommend intubation to protect against further lung injury; patient's mother expresses desire for aggressive care; patient is resistant to intubation - Continue BiPAP support, though suspect patient will quickly fatigue - Wean FiO2 for O2 sat > 90% - Pulmonary hygiene - PAD protocol for sedation: Precedex for goal RASS 0 to -1 - F/u ABG post-albuterol treatment  Influenza A infection Concern for pneumonia 2/2 influenza vs. Superimposed bacterial pneumonia Tested positive for Flu A. Daughter recent sick contact. - Continue Tamiflu - Continue empiric CAP coverage with azithromycin, ceftriaxone - Continue vancomycin, given +MRSA nasal swab - Trend CBC - F/u Resp Cx/sputum - Follow CXR  Asthma exacerbation Tobacco use History of asthma, does not utilize daily bronchodilators. Albuterol inhaler PRN for rescue. Per patient's mother, has smoked tobacco/marijuana for many years. - Continue bronchodilators - Continue Solumedrol - Follow CXR - Encourage tobacco cessation when able to participate in care  Best Practice: (right click and "Reselect all SmartList Selections" daily)   Diet/type: NPO while on BiPAP DVT prophylaxis: LMWH GI  prophylaxis: N/A Lines: N/A Foley:  N/A Code Status:  full code Last date  of multidisciplinary goals of care discussion [Bedside with mother 10/24]  Labs:  CBC: Recent Labs  Lab 01/20/21 1034 01/21/21 0308  WBC 14.5* 17.3*  NEUTROABS 13.1*  --   HGB 10.6* 10.3*  HCT 35.4* 34.7*  MCV 68.3* 69.1*  PLT 293 343   Basic Metabolic Panel: Recent Labs  Lab 01/20/21 1034 01/21/21 0308  NA 133* 134*  K 3.7 5.1  CL 102 101  CO2 22 28  GLUCOSE 147* 111*  BUN 7 6  CREATININE 0.80 0.67  CALCIUM 8.8* 9.1   GFR: Estimated Creatinine Clearance: 99.8 mL/min (by C-G formula based on SCr of 0.67 mg/dL). Recent Labs  Lab 01/20/21 1034 01/21/21 0308  WBC 14.5* 17.3*   Liver Function Tests: Recent Labs  Lab 01/20/21 1034  AST 35  ALT 22  ALKPHOS 64  BILITOT 0.5  PROT 8.0  ALBUMIN 4.2   No results for input(s): LIPASE, AMYLASE in the last 168 hours. No results for input(s): AMMONIA in the last 168 hours.  ABG:    Component Value Date/Time   PHART 7.217 (L) 01/21/2021 0750   PCO2ART 74.3 (HH) 01/21/2021 0750   PO2ART 179 (H) 01/21/2021 0750   HCO3 29.1 (H) 01/21/2021 0750   ACIDBASEDEF 2.2 (H) 01/21/2021 0140   O2SAT 99.5 01/21/2021 0750    Coagulation Profile: No results for input(s): INR, PROTIME in the last 168 hours.  Cardiac Enzymes: No results for input(s): CKTOTAL, CKMB, CKMBINDEX, TROPONINI in the last 168 hours.  HbA1C: No results found for: HGBA1C  CBG: No results for input(s): GLUCAP in the last 168 hours.  Review of Systems:   Review of systems completed with pertinent positives/negatives outlined in above HPI.  Past Medical History:  She,  has a past medical history of Asthma.   Surgical History:  History reviewed. No pertinent surgical history.   Social History:   reports that she has never smoked. She has never used smokeless tobacco. She reports that she does not currently use alcohol. She reports that she does not use drugs.   Family History:  Her family history includes Hypertension in an other family member.    Allergies: No Known Allergies   Home Medications: Prior to Admission medications   Medication Sig Start Date End Date Taking? Authorizing Provider  Acetaminophen (TYLENOL EXTRA STRENGTH PO) Take 1,300 mg by mouth every 8 (eight) hours as needed (headache).   Yes [provider]  methocarbamol (ROBAXIN) 500 MG tablet Take 1 tablet (500 mg total) by mouth 2 (two) times daily. Patient not taking: Reported on 01/20/2021 08/02/20   Tanda Rockers, PA-C   Critical care time: 40 minutes   Tim Lair, [place] [facility] 01/21/21 9:52 AM  Please see [email or web address] for pager details.  From 7A-7P if no response, please call [phone] After hours, please call ELink [phone]

## 2021-01-21 NOTE — Progress Notes (Signed)
Pharmacy Antibiotic Note  Neya Creegan is a 40 y.o. female admitted on 01/20/2021 with pneumonia and sepsis.  Pharmacy has been consulted for vancomycin dosing.  Plan: Azithromycin, Ceftriaxone, and Oseltamivir per MD.  Vancomycin 1500mg  IV x1 then 750 mg IV q12h (SCr rounded 0.8, Vd 0.5, est AUC 483) Measure Vanc levels as needed.  Goal AUC = 400 - 550.   Follow up renal function, culture results, and clinical course.   Height: 5\' 5"  (165.1 cm) Weight: 83.6 kg (184 lb 4.9 oz) IBW/kg (Calculated) : 57  Temp (24hrs), Avg:98.2 F (36.8 C), Min:97.4 F (36.3 C), Max:98.6 F (37 C)  Recent Labs  Lab 01/20/21 1034 01/21/21 0308  WBC 14.5* 17.3*  CREATININE 0.80 0.67    Estimated Creatinine Clearance: 99.8 mL/min (by C-G formula based on SCr of 0.67 mg/dL).    No Known Allergies  Antimicrobials this admission: 10/23 Oseltamivir >> (10/28) 10/24 Ceftriaxone >>  10/24 Azithromycin >>  10/24 Vancomycin >>   Dose adjustments this admission:   Microbiology results: 10/23 Influenza A: positive  10/23 MRSA PCR: detected  Thank you for allowing pharmacy to be a part of this patient's care. 11/23 PharmD, BCPS Clinical Pharmacist [facility] main pharmacy [phone] 01/21/2021 8:21 AM

## 2021-01-21 NOTE — Progress Notes (Addendum)
PROGRESS NOTE   Erika Thomas  ATF:[id number]    DOB: November 09, 1980    DOA: 01/20/2021  PCP: Pcp, No   I have briefly reviewed patients previous medical records in [facility].  Chief Complaint  Patient presents with   Shortness of Breath    Brief Narrative:  40 year old female with medical history significant for asthma versus?  COPD (per mother, no asthma but told to have COPD), obesity, presented to the ED with progressive dyspnea, wheezing, cough, right lower chest pain after exposed to her daughter who had similar respiratory illness earlier in the week.  Influenza A positive.  Admitted to ICU for acute respiratory failure with hypoxia/hypercapnia due to influenza A acute bronchitis and asthma/COPD exacerbation.  Deteriorated overnight of admission with anxiety, agitation, received Ativan and then Precedex and placed on BiPAP for a few hours.  Despite this patient with tachypnea, tiring, ABG shows acute respiratory acidosis.  PCCM consulted and will need intubation and mechanical ventilation.   Assessment & Plan:  Principal Problem:   Acute on chronic respiratory failure with hypoxia (HCC) Active Problems:   Asthma exacerbation   Influenza A   Hyperglycemia   Hypocalcemia   Acute respiratory failure with hypoxia and hypercapnia secondary to acute asthma versus COPD exacerbation complicating influenza A acute bronchitis: - Per mother, no childhood or known history of asthma but told to have COPD.  Unable to get history from patient due to respiratory extremis.  Never been intubated. - Initially treated with IV Solu-Medrol in ED, scheduled duo nebs and as needed Xopenex nebs. - Overnight of admission with worsening anxiety, agitation, respiratory distress - ABG on 10/24 at 1:40 AM: pH 7.21, PCO2 68, PO2 110 - Received a dose of Ativan and even started on Precedex drip and placed on BiPAP for several hours. - Respiratory tiring this morning, repeat ABG: pH 7.217, PCO2 74, PO2  179. - Chest x-ray 10/24 personally reviewed: No active disease. - Consulted PCCM, personally discussed with MD and PA for quick evaluation.  Patient will likely need intubation and mechanical ventilation. - NPO.  Change prednisone to IV Solu-Medrol 60 mg every 12.  Continue scheduled DuoNebs and as needed Xopenex nebs. - No clinical concern for bacterial infection.  Check procalcitonin.  Consider antibiotics. - Continue Tamiflu.  Will need NG tube after she is intubated. - We will need formal PFTs during outpatient follow-up with pulmonology.  Acute metabolic encephalopathy - Secondary to acute hypoxic and hypercapnic respiratory failure. - No focal neurological deficits - Manage underlying factors as above and monitor closely.  Tobacco abuse - Per mother has been smoking for a long time, unknown quantity - Tobacco cessation to be counseled when able.  Leukocytosis - Multifactorial due to acute viral infection, stress response and IV steroids. - Follow daily CBC  Microcytic anemia - Suspect iron deficiency. Stable. - Check ferritin and serum iron levels.  Mild hyponatremia - Clinically insignificant.  Stable.  Hyperglycemia - Check A1c.  Body mass index is 30.67 kg/m.  Obesity  Nutritional Status        Pressure Ulcer:     DVT prophylaxis: enoxaparin (LOVENOX) injection 40 mg Start: 01/20/21 2200     Code Status: Full Code Family Communication: Discussed in detail with patient's mother at bedside, updated care and answered all questions. Disposition:  Status is: Observation  The patient will require care spanning > 2 midnights and should be moved to inpatient because: Acute respiratory failure who will likely need intubation and mechanical ventilation.  Consultants:   PCCM  Procedures:   BiPAP  Antimicrobials:    Anti-infectives (From admission, onward)    Start     Dose/Rate Route Frequency Ordered Stop   01/21/21 1000  azithromycin  (ZITHROMAX) 500 mg in sodium chloride 0.9 % 250 mL IVPB        500 mg 250 mL/hr over 60 Minutes Intravenous Every 24 hours 01/21/21 0802     01/21/21 0900  cefTRIAXone (ROCEPHIN) 2 g in sodium chloride 0.9 % 100 mL IVPB        2 g 200 mL/hr over 30 Minutes Intravenous Every 24 hours 01/21/21 0802     01/21/21 0900  vancomycin (VANCOREADY) IVPB 1500 mg/300 mL        1,500 mg 150 mL/hr over 120 Minutes Intravenous  Once 01/21/21 0823     01/20/21 1500  oseltamivir (TAMIFLU) capsule 75 mg        75 mg Oral 2 times daily 01/20/21 1456 01/25/21 0959         Subjective:  Patient unable to provide much history.  Somnolent but easily arousable.  Said "I am good" but not much more.  Tachypneic on BiPAP.  Restless.  Mother at bedside.  Rest of history as above  Objective:   Vitals:   01/21/21 0440 01/21/21 0500 01/21/21 0742 01/21/21 0748  BP:  100/68    Pulse: (!) 101 96  (!) 102  Resp: (!) 31 (!) 32  (!) 27  Temp:      TempSrc:      SpO2: 99% 98% 98% 100%  Weight:      Height:        General exam: Young female, moderately built and obese lying propped up in bed.  On BiPAP.  Tachypneic.  Restless.  Trying to pull off the BiPAP. Respiratory system: Diminished breath sounds bilaterally, especially posteriorly with few expiratory rhonchi.  No crackles.  Increased work of breathing.  Accessory respiratory muscle use. Cardiovascular system: S1 & S2 heard, RRR. No JVD, murmurs, rubs, gallops or clicks. No pedal edema.  Telemetry personally reviewed: Had sinus tachycardia in the 120s earlier in admission, now down to sinus rhythm. Gastrointestinal system: Abdomen is nondistended, soft and nontender. No organomegaly or masses felt. Normal bowel sounds heard. Central nervous system: Mental status as noted above. No focal neurological deficits. Extremities: Symmetric 5 x 5 power. Skin: No rashes, lesions or ulcers Psychiatry: Judgement and insight not be assessed at this time. Mood & affect  cannot be assessed at this time    Data Reviewed:   I have personally reviewed following labs and imaging studies   CBC: Recent Labs  Lab 01/20/21 1034 01/21/21 0308  WBC 14.5* 17.3*  NEUTROABS 13.1*  --   HGB 10.6* 10.3*  HCT 35.4* 34.7*  MCV 68.3* 69.1*  PLT 293 343    Basic Metabolic Panel: Recent Labs  Lab 01/20/21 1034 01/21/21 0308  NA 133* 134*  K 3.7 5.1  CL 102 101  CO2 22 28  GLUCOSE 147* 111*  BUN 7 6  CREATININE 0.80 0.67  CALCIUM 8.8* 9.1    Liver Function Tests: Recent Labs  Lab 01/20/21 1034  AST 35  ALT 22  ALKPHOS 64  BILITOT 0.5  PROT 8.0  ALBUMIN 4.2    CBG: No results for input(s): GLUCAP in the last 168 hours.  Microbiology Studies:   Recent Results (from the past 240 hour(s))  Resp Panel by RT-PCR (Flu A&B, Covid) Nasopharyngeal Swab  Status: Abnormal   Collection Time: 01/20/21 10:34 AM   Specimen: Nasopharyngeal Swab; Nasopharyngeal(NP) swabs in vial transport medium  Result Value Ref Range Status   SARS Coronavirus 2 by RT PCR NEGATIVE NEGATIVE Final    Comment: (NOTE) SARS-CoV-2 target nucleic acids are NOT DETECTED.  The SARS-CoV-2 RNA is generally detectable in upper respiratory specimens during the acute phase of infection. The lowest concentration of SARS-CoV-2 viral copies this assay can detect is 138 copies/mL. A negative result does not preclude SARS-Cov-2 infection and should not be used as the sole basis for treatment or other patient management decisions. A negative result may occur with  improper specimen collection/handling, submission of specimen other than nasopharyngeal swab, presence of viral mutation(s) within the areas targeted by this assay, and inadequate number of viral copies(<138 copies/mL). A negative result must be combined with clinical observations, patient history, and epidemiological information. The expected result is Negative.  Fact Sheet for Patients:   BloggerCourse.com  Fact Sheet for Healthcare Providers:  SeriousBroker.it  This test is no t yet approved or cleared by the [place] FDA and  has been authorized for detection and/or diagnosis of SARS-CoV-2 by FDA under an Emergency Use Authorization (EUA). This EUA will remain  in effect (meaning this test can be used) for the duration of the COVID-19 declaration under Section 564(b)(1) of the Act, 21 U.S.C.section 360bbb-3(b)(1), unless the authorization is terminated  or revoked sooner.       Influenza A by PCR POSITIVE (A) NEGATIVE Final   Influenza B by PCR NEGATIVE NEGATIVE Final    Comment: (NOTE) The Xpert Xpress SARS-CoV-2/FLU/RSV plus assay is intended as an aid in the diagnosis of influenza from Nasopharyngeal swab specimens and should not be used as a sole basis for treatment. Nasal washings and aspirates are unacceptable for Xpert Xpress SARS-CoV-2/FLU/RSV testing.  Fact Sheet for Patients: BloggerCourse.com  Fact Sheet for Healthcare Providers: SeriousBroker.it  This test is not yet approved or cleared by the [place] FDA and has been authorized for detection and/or diagnosis of SARS-CoV-2 by FDA under an Emergency Use Authorization (EUA). This EUA will remain in effect (meaning this test can be used) for the duration of the COVID-19 declaration under Section 564(b)(1) of the Act, 21 U.S.C. section 360bbb-3(b)(1), unless the authorization is terminated or revoked.  Performed at [facility], [address] [place], [place] [address]   MRSA Next Gen by PCR, Nasal     Status: Abnormal   Collection Time: 01/20/21  5:59 PM   Specimen: Nasal Mucosa; Nasal Swab  Result Value Ref Range Status   MRSA by PCR Next Gen DETECTED (A) NOT DETECTED Final    Comment: RESULT CALLED TO, READ BACK BY AND VERIFIED WITH: WILLIAMSON,J RN @2022  ON  01/20/21 JACKSON,K (NOTE) The GeneXpert MRSA Assay (FDA approved for NASAL specimens only), is one component of a comprehensive MRSA colonization surveillance program. It is not intended to diagnose MRSA infection nor to guide or monitor treatment for MRSA infections. Test performance is not FDA approved in patients less than 36 years old. Performed at [facility], [address], [place], [place]      Radiology Studies:  DG CHEST PORT 1 VIEW  Result Date: 01/21/2021 CLINICAL DATA:  Acute respiratory failure with hypoxia. EXAM: PORTABLE CHEST 1 VIEW COMPARISON:  January 20, 2021. FINDINGS: The heart size and mediastinal contours are within normal limits. Both lungs are clear. The visualized skeletal structures are unremarkable. IMPRESSION: No  active disease. Electronically Signed   By: Lupita Raider M.D.   On: 01/21/2021 08:31   DG Chest Port 1 View  Result Date: 01/20/2021 CLINICAL DATA:  Shortness of breath for 2 days in a 40 year old female. EXAM: PORTABLE CHEST 1 VIEW COMPARISON:  Aug 02, 2020. FINDINGS: EKG leads project over the chest. Trachea midline. Cardiomediastinal contours and hilar structures are normal. Lungs are clear. No sign of effusion on frontal radiograph. On limited assessment there is no acute skeletal process. IMPRESSION: No acute cardiopulmonary disease. Electronically Signed   By: Donzetta Kohut M.D.   On: 01/20/2021 10:18     Scheduled Meds:    Chlorhexidine Gluconate Cloth  6 each Topical Q0600   enoxaparin (LOVENOX) injection  40 mg Subcutaneous Q24H   ipratropium-albuterol  3 mL Nebulization Q6H   mouth rinse  15 mL Mouth Rinse BID   methylPREDNISolone (SOLU-MEDROL) injection  60 mg Intravenous Q12H   mupirocin ointment  1 application Nasal BID   oseltamivir  75 mg Oral BID    Continuous Infusions:    azithromycin     cefTRIAXone (ROCEPHIN)  IV     dexmedetomidine (PRECEDEX) IV infusion 0.5 mcg/kg/hr (01/21/21 0649)    lactated ringers     vancomycin       LOS: 0 days     Marcellus Scott, MD, [place], [facility]. [facility]    To contact the attending provider between 7A-7P or the covering provider during after hours 7P-7A, please log into the web site [email or web address] and access using universal [place] password for that web site. If you do not have the password, please call the hospital operator.  01/21/2021, 8:48 AM

## 2021-01-21 NOTE — Progress Notes (Signed)
Erika Thomas is feeling significantly improved this afternoon and is off bipap on [place]. No accessory muscle use or tachypnea. Speaking in full sentences. Her mother is at bedside and said she has been much better throughout the afternoon. She admits she was afraid of MV because of stories she heard of people dying on the vent with covid, but we discussed that she doesn't have covid and asthma is very different.  Q4h nebs overnight & Q2h PRN Con't high dose steroids Con't ICU monitoring overnight BiPAP PRN overnight.  Steffanie Dunn, DO 01/21/21 6:34 PM [place] [facility]

## 2021-01-21 NOTE — Progress Notes (Signed)
Patient seen and examined at bedside. Doing fair on bipap + precedex. Worse ABGs. Not moving much air. Mom wants aggressive care, Talyn is resistant to the idea of intubation though.   CAT x 4 hours. Reassess with ABG later. Will follow up with patient and her mom later this morning and continue to discuss intubation as a possibility if needed. Work of breathing is currently appropriate, but I worry about large Vt on bipap.  Steffanie Dunn, DO 01/21/21 9:09 AM [place] [facility]

## 2021-01-22 DIAGNOSIS — J45901 Unspecified asthma with (acute) exacerbation: Secondary | ICD-10-CM

## 2021-01-22 DIAGNOSIS — Z87891 Personal history of nicotine dependence: Secondary | ICD-10-CM

## 2021-01-22 DIAGNOSIS — J441 Chronic obstructive pulmonary disease with (acute) exacerbation: Secondary | ICD-10-CM

## 2021-01-22 LAB — BASIC METABOLIC PANEL
Anion gap: 8 (ref 5–15)
BUN: 9 mg/dL (ref 6–20)
CO2: 29 mmol/L (ref 22–32)
Calcium: 9.6 mg/dL (ref 8.9–10.3)
Chloride: 101 mmol/L (ref 98–111)
Creatinine, Ser: 0.71 mg/dL (ref 0.44–1.00)
GFR, Estimated: >60 mL/min (ref >60)
Glucose, Bld: 99 mg/dL (ref 70–99)
Potassium: 4.8 mmol/L (ref 3.5–5.1)
Sodium: 138 mmol/L (ref 135–145)

## 2021-01-22 LAB — PROCALCITONIN: Procalcitonin: <0.1 ng/mL

## 2021-01-22 LAB — CBC
HCT: 33.3 % — ABNORMAL LOW (ref 36.0–46.0)
Hemoglobin: 10.1 g/dL — ABNORMAL LOW (ref 12.0–15.0)
MCH: 20.7 pg — ABNORMAL LOW (ref 26.0–34.0)
MCHC: 30.3 g/dL (ref 30.0–36.0)
MCV: 68.1 fL — ABNORMAL LOW (ref 80.0–100.0)
Platelets: 345 10*3/uL (ref 150–400)
RBC: 4.89 MIL/uL (ref 3.87–5.11)
RDW: 18.4 % — ABNORMAL HIGH (ref 11.5–15.5)
WBC: 20.7 10*3/uL — ABNORMAL HIGH (ref 4.0–10.5)
nRBC: 0 % (ref 0.0–0.2)

## 2021-01-22 MED ORDER — MOMETASONE FURO-FORMOTEROL FUM 100-5 MCG/ACT IN AERO
2.0000 | INHALATION_SPRAY | Freq: Two times a day (BID) | RESPIRATORY_TRACT | Status: DC
  Administered 2021-01-23 – 2021-01-25 (×5): 2 via RESPIRATORY_TRACT
  Filled 2021-01-22: qty 8.8

## 2021-01-22 MED ORDER — GUAIFENESIN ER 600 MG PO TB12
1200.0000 mg | ORAL_TABLET | Freq: Two times a day (BID) | ORAL | Status: DC
  Administered 2021-01-22 – 2021-01-25 (×7): 1200 mg via ORAL
  Filled 2021-01-22 (×7): qty 2

## 2021-01-22 MED ORDER — LIP MEDEX EX OINT
TOPICAL_OINTMENT | CUTANEOUS | Status: AC
  Filled 2021-01-22: qty 7

## 2021-01-22 MED ORDER — NICOTINE 21 MG/24HR TD PT24
21.0000 mg | MEDICATED_PATCH | Freq: Every day | TRANSDERMAL | Status: DC
  Administered 2021-01-22 – 2021-01-24 (×3): 21 mg via TRANSDERMAL
  Filled 2021-01-22 (×4): qty 1

## 2021-01-22 MED ORDER — IBUPROFEN 200 MG PO TABS
600.0000 mg | ORAL_TABLET | Freq: Once | ORAL | Status: DC
  Filled 2021-01-22: qty 3

## 2021-01-22 MED ORDER — FERROUS SULFATE 325 (65 FE) MG PO TABS
325.0000 mg | ORAL_TABLET | Freq: Two times a day (BID) | ORAL | Status: DC
  Administered 2021-01-22 – 2021-01-25 (×6): 325 mg via ORAL
  Filled 2021-01-22 (×6): qty 1

## 2021-01-22 MED ORDER — IBUPROFEN 100 MG PO CHEW
600.0000 mg | CHEWABLE_TABLET | Freq: Once | ORAL | Status: DC
  Filled 2021-01-22 (×2): qty 6

## 2021-01-22 MED ORDER — LORAZEPAM 0.5 MG PO TABS
0.5000 mg | ORAL_TABLET | Freq: Four times a day (QID) | ORAL | Status: DC | PRN
  Administered 2021-01-22: 0.5 mg via ORAL
  Filled 2021-01-22: qty 1

## 2021-01-22 NOTE — Progress Notes (Signed)
RT placed pt on BIPAP V60 per pt request. Pt tolerating well. Pt respiratory status stable w/no distress noted at this time. RT will continue to monitor.

## 2021-01-22 NOTE — Progress Notes (Signed)
NAME:  Erika Thomas, MRN:  [id number], DOB:  [birth year], LOS: 1 ADMISSION DATE:  01/20/2021 CONSULTATION DATE:  01/21/2021 REFERRING MD:  Waymon Amato - TR[facility] CHIEF COMPLAINT:  Dyspnea/SOB, Influenza A   History of Present Illness:  40 year old woman who presented to [facility] ED 10/23 for 2-day history of SOB. PMHx significant for asthma vs. COPD and tobacco use. Per patient's mother (at bedside), patient has been coughing for "weeks" with worsening over the last 2-3 days. Tested positive for Flu A. Of note, patient's daughter has recently been ill from a respiratory illness  In the ED, patient was dyspneic, tachypneic and hypoxic to 84% on RA. O2 sats improved on 4L [place]. IV Solumedrol, IV mag and albuterol nebs were administered with improvement. Flu A+ and Tamiflu initiated. Given persistent O2 requirement, she was ultimately admitted.  On 10/24AM, patient was noted to be progressively dyspneic/tachypneic with O2 desaturation. BiPAP was initiated. PCCM was consulted for further evaluation and management.  Pertinent Medical History:  Asthma vs. COPD, history of tobacco and marijuana use  Significant Hospital Events: Including procedures, antibiotic start and stop dates in addition to other pertinent events   10/23 - Admitted to [facility]) for worsening dyspnea, Flu A + 10/24 - Increasing tachypnea, dyspnea, SOB. Placed on BiPAP. Precedex for agitation. PCCM consulted. 10/25 - Significantly improved respiratory status. 2L [place]. Used BiPAP overnight. Mentation improved.  Interim History / Subjective:  Feeling much better today Sitting up in bed, mom at bedside Improved appetite/feels hungry Ongoing nonproductive cough Chest still "feels like there's a brick in it" but no longer wheezing Anxiety improved  Objective:  Blood pressure (!) 135/91, pulse (!) 104, temperature 98.6 F (37 C), temperature source Oral, resp. rate 16, height 5\' 5"  (1.651 m), weight 83.6 kg, SpO2 97 %.    FiO2 (%):  [30  %-35 %] 30 %   Intake/Output Summary (Last 24 hours) at 01/22/2021 0722 Last data filed at 01/22/2021 0655 Gross per 24 hour  Intake 1172.95 ml  Output 2350 ml  Net -1177.05 ml    Filed Weights   01/21/21 0327  Weight: 83.6 kg   Physical Examination: General: Acutely ill-appearing woman in NAD. HEENT: [place]/AT, anicteric sclera, PERRL, dry mucous membranes. Neuro: Awake, oriented x 4. Responds to verbal stimuli. Following commands consistently. Moves all 4 extremities spontaneously.  CV: RRR, no m/g/r. PULM: Breathing even and unlabored on 2L [place]. Lung fields with improved air movement from prior exam. Less wheezing. Diminished at bilateral bases. GI: Soft, nontender, nondistended. Normoactive bowel sounds. Extremities: No LE edema noted. Skin: Warm/dry, no rashes.  Resolved Hospital Problem List:    Assessment & Plan:  Acute hypoxemic and hypercapnic respiratory failure, improving Patient respiratory status significantly improved 10/24, did not require intubation after escalation of bronchodilator treatments and BiPAP. Tolerating supplemental O2 via [place], BiPAP QHS. - Continue supplemental O2 support - Wean O2 for sat > 90% - Pulmonary hygiene + IS/flutter valve - BiPAP QHS + PRN  Influenza A infection Concern for pneumonia 2/2 influenza vs. Superimposed bacterial pneumonia Leukocytosis Tested positive for Flu A. Daughter recent sick contact. - Continue Tamiflu - Continue empiric CAP coverage (azithro, ceftriaxone) - Continue vanc, given +MRSA nasal swab - Trend CBC, suspect WBC elevation multifactorial in the setting of steroids and infection  Asthma exacerbation Tobacco use History of asthma, does not utilize daily bronchodilators. Albuterol inhaler PRN for rescue. Per patient's mother, has smoked tobacco/marijuana for many years. - Continue frequent bronchodilator treatments - DuoNeb Q4H, albuterol  Q2H PRN - Continue Solumedrol - Follow CXR - Encourage  tobacco cessation  Best Practice: (right click and "Reselect all SmartList Selections" daily)   Diet/type: NPO while on BiPAP DVT prophylaxis: LMWH GI prophylaxis: N/A Lines: N/A Foley:  N/A Code Status:  full code Last date of multidisciplinary goals of care discussion [Bedside with mother 10/24]  Critical care time: N/A    Faythe Ghee [facility] 01/22/21 7:22 AM  Please see [email or web address] for pager details.  From 7A-7P if no response, please call [phone] After hours, please call ELink [phone]

## 2021-01-22 NOTE — Progress Notes (Signed)
Chaplain assisted [name] in filling out Advance Directives and having the document notarized.  Chaplain made copies and placed one in the patient's chart.  Chaplain also provided listening support as she processed the medical scare that she had yesterday.   Chaplain Dyanne Carrel, Bcc Pager, [phone] 2:48 PM

## 2021-01-22 NOTE — Progress Notes (Signed)
PROGRESS NOTE   Erika Thomas  BMW:[id number]    DOB: 06/14/1980    DOA: 01/20/2021  PCP: Pcp, No   I have briefly reviewed patients previous medical records in [facility].  Chief Complaint  Patient presents with   Shortness of Breath    Brief Narrative:  40 year old female with medical history significant for asthma versus?  COPD (per mother, no asthma but told to have COPD), obesity, presented to the ED with progressive dyspnea, wheezing, cough, right lower chest pain after exposed to her daughter who had similar respiratory illness earlier in the week.  Influenza A positive.  Admitted to ICU for acute respiratory failure with hypoxia/hypercapnia due to influenza A acute bronchitis and asthma/COPD exacerbation.  Deteriorated overnight of admission with anxiety, agitation, received Ativan and then Precedex and placed on BiPAP for a few hours.  Despite this patient with tachypnea, tiring, ABG shows acute respiratory acidosis.  PCCM consulted 10/24 for likely intubation and mechanical ventilation.  However patient/family declined intubation.  Patient was managed closely with CAT x4 hours, gradually improved and did not require intubation.   Assessment & Plan:  Principal Problem:   Acute on chronic respiratory failure with hypoxia (HCC) Active Problems:   Asthma exacerbation   Influenza A   Hyperglycemia   Hypocalcemia   Acute respiratory failure with hypoxia and hypercapnia (HCC)   Acute respiratory failure with hypoxia and hypercapnia secondary to acute asthma versus COPD exacerbation complicating influenza A acute bronchitis: - Per mother, no childhood or known history of asthma but told to have COPD.  Unable to get history from patient due to respiratory extremis.  Never been intubated. - Initially treated with IV Solu-Medrol in ED, scheduled duo nebs and as needed Xopenex nebs. - Overnight of admission with worsening anxiety, agitation, respiratory distress - ABG on 10/24  at 1:40 AM: pH 7.21, PCO2 68, PO2 110 - Received a dose of Ativan and even started on Precedex drip and placed on BiPAP for several hours. - Respiratory tiring 10/24 morning, repeat ABG: pH 7.217, PCO2 74, PO2 179. - Chest x-ray 10/24 personally reviewed: No active disease. - PCCM consulted 10/24 for likely intubation and mechanical ventilation.  However patient/family declined intubation.  Patient was managed closely with CAT x4 hours, gradually improved and did not require intubation.  Currently on 2 L/min [place] oxygen.  Did require BiPAP last night. - No clinical concern for bacterial infection.  Check procalcitonin.  Patient currently on IV ceftriaxone and azithromycin-defer to PCCM. - Complete course of Tamiflu. - We will need formal PFTs during outpatient follow-up with pulmonology. -Patient heavy and longtime smoker.  Has been smoking since age 5.  A pack a day for several years.  Could very well have COPD.  Tobacco abuse - Cessation counseled. - Nicotine patch.  Acute metabolic encephalopathy - Secondary to acute hypoxic and hypercapnic respiratory failure. - No focal neurological deficits - Resolved.  Leukocytosis - Multifactorial due to acute viral infection, stress response and IV steroids. - WBC 21, likely related to steroids and stress response.  Microcytic anemia/iron deficiency anemia - Iron 20, ferritin 19.  Etiology unclear.  Could be poor oral intake but need to obtain menstrual history from her. - Initiated ferrous sulfate 325 Mg twice daily.  Mild hyponatremia - Clinically insignificant.  Resolved.  Hyperglycemia - A1c 5.3.  Body mass index is 30.67 kg/m.  Obesity  Nutritional Status        Pressure Ulcer:     DVT prophylaxis: enoxaparin (  LOVENOX) injection 40 mg Start: 01/20/21 2200     Code Status: Full Code Family Communication: Discussed in detail with patient's mother at bedside today, updated care and answered all questions. Disposition:   Status is: Inpatient.  Patient has clinically improved.  However still has decreased breath sounds on lung exam.  Remains at risk for worsening.  Recommend monitoring an additional 24 hours in the stepdown unit prior to transfer out to the floor.  Remains on IV steroids.       Consultants:   PCCM  Procedures:   BiPAP  Antimicrobials:    Anti-infectives (From admission, onward)    Start     Dose/Rate Route Frequency Ordered Stop   01/21/21 2200  vancomycin (VANCOREADY) IVPB 750 mg/150 mL        750 mg 150 mL/hr over 60 Minutes Intravenous Every 12 hours 01/21/21 1342     01/21/21 1000  azithromycin (ZITHROMAX) 500 mg in sodium chloride 0.9 % 250 mL IVPB        500 mg 250 mL/hr over 60 Minutes Intravenous Every 24 hours 01/21/21 0802     01/21/21 0900  cefTRIAXone (ROCEPHIN) 2 g in sodium chloride 0.9 % 100 mL IVPB        2 g 200 mL/hr over 30 Minutes Intravenous Every 24 hours 01/21/21 0802     01/21/21 0900  vancomycin (VANCOREADY) IVPB 1500 mg/300 mL        1,500 mg 150 mL/hr over 120 Minutes Intravenous  Once 01/21/21 0823 01/21/21 1412   01/20/21 1500  oseltamivir (TAMIFLU) capsule 75 mg        75 mg Oral 2 times daily 01/20/21 1456 01/25/21 0959         Subjective:  Feels better.  Asking for regular food because she is "starving".  Has not much recollection about the events of yesterday.  Smoking history as above.  No formal diagnosis of asthma or COPD.  Coughing with intermittent minimal white sputum.  Dyspnea improved but still has with coughing and activity.  Was asking her mother if she could smoke.  Objective:   Vitals:   01/22/21 0625 01/22/21 0700 01/22/21 0800 01/22/21 0828  BP: (!) 142/83 (!) 135/91    Pulse: (!) 105 (!) 104 (!) 109   Resp: (!) 21 16 19    Temp:      TempSrc:      SpO2: 97% 97% 98% 95%  Weight:      Height:        General exam: Young female, moderately built and obese lying propped up in bed.  Looks much improved compared to  yesterday. Respiratory system: Improved breath sounds compared to yesterday but still diminished bilaterally.  No obvious wheezing or rhonchi appreciated.  No crackles.  No increased work of breathing. Cardiovascular system: S1 and S2 heard, RRR.  No JVD, murmurs or pedal edema. Gastrointestinal system: Abdomen is nondistended, soft and nontender. No organomegaly or masses felt. Normal bowel sounds heard. Central nervous system: Alert and oriented. No focal neurological deficits. Extremities: Symmetric 5 x 5 power. Skin: No rashes, lesions or ulcers Psychiatry: Judgement and insight intact.  Mood & affect appropriate    Data Reviewed:   I have personally reviewed following labs and imaging studies   CBC: Recent Labs  Lab 01/20/21 1034 01/21/21 0308 01/22/21 0305  WBC 14.5* 17.3* 20.7*  NEUTROABS 13.1*  --   --   HGB 10.6* 10.3* 10.1*  HCT 35.4* 34.7* 33.3*  MCV 68.3* 69.1*  68.1*  PLT 293 343 345    Basic Metabolic Panel: Recent Labs  Lab 01/20/21 1034 01/21/21 0308 01/22/21 0305  NA 133* 134* 138  K 3.7 5.1 4.8  CL 102 101 101  CO2 22 28 29   GLUCOSE 147* 111* 99  BUN 7 6 9   CREATININE 0.80 0.67 0.71  CALCIUM 8.8* 9.1 9.6    Liver Function Tests: Recent Labs  Lab 01/20/21 1034  AST 35  ALT 22  ALKPHOS 64  BILITOT 0.5  PROT 8.0  ALBUMIN 4.2    CBG: No results for input(s): GLUCAP in the last 168 hours.  Microbiology Studies:   Recent Results (from the past 240 hour(s))  Resp Panel by RT-PCR (Flu A&B, Covid) Nasopharyngeal Swab     Status: Abnormal   Collection Time: 01/20/21 10:34 AM   Specimen: Nasopharyngeal Swab; Nasopharyngeal(NP) swabs in vial transport medium  Result Value Ref Range Status   SARS Coronavirus 2 by RT PCR NEGATIVE NEGATIVE Final    Comment: (NOTE) SARS-CoV-2 target nucleic acids are NOT DETECTED.  The SARS-CoV-2 RNA is generally detectable in upper respiratory specimens during the acute phase of infection. The  lowest concentration of SARS-CoV-2 viral copies this assay can detect is 138 copies/mL. A negative result does not preclude SARS-Cov-2 infection and should not be used as the sole basis for treatment or other patient management decisions. A negative result may occur with  improper specimen collection/handling, submission of specimen other than nasopharyngeal swab, presence of viral mutation(s) within the areas targeted by this assay, and inadequate number of viral copies(<138 copies/mL). A negative result must be combined with clinical observations, patient history, and epidemiological information. The expected result is Negative.  Fact Sheet for Patients:  01/22/21  Fact Sheet for Healthcare Providers:  01/22/21  This test is no t yet approved or cleared by the [employer] FDA and  has been authorized for detection and/or diagnosis of SARS-CoV-2 by FDA under an Emergency Use Authorization (EUA). This EUA will remain  in effect (meaning this test can be used) for the duration of the COVID-19 declaration under Section 564(b)(1) of the Act, 21 U.S.C.section 360bbb-3(b)(1), unless the authorization is terminated  or revoked sooner.       Influenza A by PCR POSITIVE (A) NEGATIVE Final   Influenza B by PCR NEGATIVE NEGATIVE Final    Comment: (NOTE) The Xpert Xpress SARS-CoV-2/FLU/RSV plus assay is intended as an aid in the diagnosis of influenza from Nasopharyngeal swab specimens and should not be used as a sole basis for treatment. Nasal washings and aspirates are unacceptable for Xpert Xpress SARS-CoV-2/FLU/RSV testing.  Fact Sheet for Patients: SeriousBroker.it  Fact Sheet for Healthcare Providers: [place]  This test is not yet approved or cleared by the [employer] FDA and has been authorized for detection and/or diagnosis of SARS-CoV-2 by FDA  under an Emergency Use Authorization (EUA). This EUA will remain in effect (meaning this test can be used) for the duration of the COVID-19 declaration under Section 564(b)(1) of the Act, 21 U.S.C. section 360bbb-3(b)(1), unless the authorization is terminated or revoked.  Performed at [facility], [address], [place], [place]   MRSA Next Gen by PCR, Nasal     Status: Abnormal   Collection Time: 01/20/21  5:59 PM   Specimen: Nasal Mucosa; Nasal Swab  Result Value Ref Range Status   MRSA by PCR Next Gen DETECTED (A) NOT DETECTED Final    Comment: RESULT CALLED TO, READ BACK  BY AND VERIFIED WITH: Cherrie Gauze RN @2022  ON 01/20/21 JACKSON,K (NOTE) The GeneXpert MRSA Assay (FDA approved for NASAL specimens only), is one component of a comprehensive MRSA colonization surveillance program. It is not intended to diagnose MRSA infection nor to guide or monitor treatment for MRSA infections. Test performance is not FDA approved in patients less than 77 years old. Performed at [facility], [address] [place], [place]      Radiology Studies:  DG CHEST PORT 1 VIEW  Result Date: 01/21/2021 CLINICAL DATA:  Acute respiratory failure with hypoxia. EXAM: PORTABLE CHEST 1 VIEW COMPARISON:  January 20, 2021. FINDINGS: The heart size and mediastinal contours are within normal limits. Both lungs are clear. The visualized skeletal structures are unremarkable. IMPRESSION: No active disease. Electronically Signed   By: January 22, 2021 M.D.   On: 01/21/2021 08:31   DG Chest Port 1 View  Result Date: 01/20/2021 CLINICAL DATA:  Shortness of breath for 2 days in a 39 year old female. EXAM: PORTABLE CHEST 1 VIEW COMPARISON:  Aug 02, 2020. FINDINGS: EKG leads project over the chest. Trachea midline. Cardiomediastinal contours and hilar structures are normal. Lungs are clear. No sign of effusion on frontal radiograph. On limited assessment  there is no acute skeletal process. IMPRESSION: No acute cardiopulmonary disease. Electronically Signed   By: Aug 04, 2020 M.D.   On: 01/20/2021 10:18     Scheduled Meds:    chlorhexidine  15 mL Mouth Rinse BID   Chlorhexidine Gluconate Cloth  6 each Topical Q0600   enoxaparin (LOVENOX) injection  40 mg Subcutaneous Q24H   ibuprofen  600 mg Oral Once   ipratropium-albuterol  3 mL Nebulization Q4H   mouth rinse  15 mL Mouth Rinse BID   methylPREDNISolone (SOLU-MEDROL) injection  60 mg Intravenous Q6H   mupirocin ointment  1 application Nasal BID   nicotine  21 mg Transdermal Daily   oseltamivir  75 mg Oral BID    Continuous Infusions:    azithromycin Stopped (01/21/21 1151)   cefTRIAXone (ROCEPHIN)  IV 2 g (01/22/21 0845)   dexmedetomidine (PRECEDEX) IV infusion Stopped (01/21/21 1710)   lactated ringers 10 mL/hr at 01/21/21 1049   vancomycin Stopped (01/21/21 2307)     LOS: 1 day     2308, MD, [place], [facility]. [facility]    To contact the attending provider between 7A-7P or the covering provider during after hours 7P-7A, please log into the web site [email or web address] and access using universal [place] password for that web site. If you do not have the password, please call the hospital operator.  01/22/2021, 8:55 AM

## 2021-01-23 ENCOUNTER — Telehealth: Payer: Self-pay | Admitting: Pulmonary Disease

## 2021-01-23 DIAGNOSIS — J4551 Severe persistent asthma with (acute) exacerbation: Secondary | ICD-10-CM

## 2021-01-23 DIAGNOSIS — F17200 Nicotine dependence, unspecified, uncomplicated: Secondary | ICD-10-CM | POA: Diagnosis present

## 2021-01-23 DIAGNOSIS — D509 Iron deficiency anemia, unspecified: Secondary | ICD-10-CM | POA: Diagnosis present

## 2021-01-23 DIAGNOSIS — G9341 Metabolic encephalopathy: Secondary | ICD-10-CM | POA: Diagnosis present

## 2021-01-23 LAB — PROCALCITONIN: Procalcitonin: <0.1 ng/mL

## 2021-01-23 MED ORDER — IPRATROPIUM-ALBUTEROL 0.5-2.5 (3) MG/3ML IN SOLN
3.0000 mL | Freq: Four times a day (QID) | RESPIRATORY_TRACT | Status: DC
  Administered 2021-01-23 – 2021-01-25 (×7): 3 mL via RESPIRATORY_TRACT
  Filled 2021-01-23 (×8): qty 3

## 2021-01-23 MED ORDER — METHYLPREDNISOLONE SODIUM SUCC 125 MG IJ SOLR
60.0000 mg | Freq: Two times a day (BID) | INTRAMUSCULAR | Status: DC
  Filled 2021-01-23: qty 2

## 2021-01-23 NOTE — Assessment & Plan Note (Signed)
Due to influenza.

## 2021-01-23 NOTE — Assessment & Plan Note (Signed)
-   Start iron - Outpatient GI follow-up 

## 2021-01-23 NOTE — Progress Notes (Addendum)
NAME:  Erika Thomas, MRN:  [id number], DOB:  1980/04/26, LOS: 2 ADMISSION DATE:  01/20/2021 CONSULTATION DATE:  01/21/2021 REFERRING MD:  Waymon Amato - TRH CHIEF COMPLAINT:  Dyspnea/SOB, Influenza A   History of Present Illness:  40 year old woman who presented to [facility] ED 10/23 for 2-day history of SOB. PMHx significant for asthma vs. COPD and tobacco use. Per patient's mother (at bedside), patient has been coughing for "weeks" with worsening over the last 2-3 days. Tested positive for Flu A. Of note, patient's daughter has recently been ill from a respiratory illness and tested positive for the flu.  In the ED, patient was dyspneic, tachypneic and hypoxic to 84% on RA. O2 sats improved on 4L [place]. IV Solumedrol, IV mag and albuterol nebs were administered with improvement. Flu A+ and Tamiflu initiated. Given persistent O2 requirement, she was ultimately admitted.  On 10/24AM, patient was noted to be progressively dyspneic/tachypneic with O2 desaturation. BiPAP was initiated. PCCM was consulted for further evaluation and management.  Pertinent Medical History:  Asthma vs. COPD, history of tobacco and marijuana use  Significant Hospital Events: Including procedures, antibiotic start and stop dates in addition to other pertinent events   10/23 Admitted to [facility]) for worsening dyspnea, Flu A + 10/24 Increasing tachypnea, dyspnea, SOB. Placed on BiPAP. Precedex for agitation. PCCM consulted. 10/25 Significantly improved respiratory status. 2L [place]. Used BiPAP overnight. Mentation improved. 10/26 Off BiPAP, on [place] O2, ambulates to rest room w/o difficulty  Interim History / Subjective:  Afebrile  No am labs O2 needs remains stable, 2L  I/O UOP, - in last 24 hours  Pt reports she continues to cough, "loosening up" with some sputum production.  Asking for change of her nicotine patch.   States she lost her job and does not have access to care.    Objective:  Blood pressure  108/80, pulse 97, temperature 98.7 F (37.1 C), temperature source Oral, resp. rate (!) 24, height 5\' 5"  (1.651 m), weight 83.6 kg, SpO2 98 %.        Intake/Output Summary (Last 24 hours) at 01/23/2021 0758 Last data filed at 01/22/2021 1200 Gross per 24 hour  Intake 508.26 ml  Output 650 ml  Net -141.74 ml   Filed Weights   01/21/21 0327  Weight: 83.6 kg   Physical Examination: General: adult female sitting up in bed in NAD HEENT: MM pink/moist, [place] O2, Neuro: AAOx4, speech clear, MAE CV: s1s2 RRR, no m/r/g PULM: non-labored at rest, lungs bilaterally with good air entry, no wheezing, few basilar inspiratory crackles GI: soft, bsx4 active  Extremities: warm/dry, no edema  Skin: no rashes or lesions  Resolved Hospital Problem List:    Assessment & Plan:   Acute hypoxemic and hypercapnic respiratory failure, improving Patient respiratory status significantly improved 10/24, did not require intubation after escalation of bronchodilator treatments and BiPAP. Tolerating supplemental O2 via [place], BiPAP QHS. -wean O2 for sats 90-95% -assess ambulatory O2 needs prior to discharge -pulmonary hygiene - IS, moblize -change BiPAP to QHS PRN   Influenza A infection Concern for pneumonia 2/2 influenza vs. Superimposed bacterial pneumonia Leukocytosis Tested positive for Flu A. Daughter recent sick contact. -Tamiflu as ordered for 5 days -patient to complete abx 10/26  Asthma exacerbation Tobacco use History of asthma, does not utilize daily bronchodilators. Albuterol inhaler PRN for rescue. Per patient's mother, has smoked tobacco/marijuana for many years. -continue duoneb while inpatient, PRN albuterol  -transition solumedrol to 60 mg Q12, plan for steroid taper over  7 days -follow intermittent CXR -tobacco cessation counseling 10/26, reviewed risks with asthma  -nicotine patch  -hospital follow up being arranged -plan for nebulized LAMA/LABA/ICS at discharge + albuterol  PRN.  May be her least expensive regimen for discharge at this point.   Anxiety  -was previously on celexa but lost insurance, will need to establish with primary for further care  Best Practice: (right click and "Reselect all SmartList Selections" daily)  Diet/type: Regular consistency (see orders)   DVT prophylaxis: LMWH GI prophylaxis: N/A Lines: N/A Foley:  N/A Code Status:  full code Last date of multidisciplinary goals of care discussion - patient updated on plan of care.    PCCM will be available PRN. Please call back if new needs arise.   Critical care time: N/A    Canary Brim, MSN, APRN, NP-C, AGACNP-BC [facility] [facility] 01/23/2021, 7:59 AM   Please see [email or web address] for pager details.   From 7A-7P if no response, please call [phone] After hours, please call ELink [phone]

## 2021-01-23 NOTE — Progress Notes (Addendum)
   01/23/21 0542  Vitals  Pulse Rate (!) 103  ECG Heart Rate (!) 107  Resp (!) 25  Oxygen Therapy  SpO2 (!) 61 %   PT spO2 down to 58% on 2L [place] while asleep.  Woke pt up, increased O2 and spO2 quickly raised to mid 90's.  Pt continues to maintain mid 90's on 2L [place].  Chinita Greenland NP notified.  Will continue to monitor

## 2021-01-23 NOTE — Assessment & Plan Note (Signed)
Continue Tamiflu 

## 2021-01-23 NOTE — Assessment & Plan Note (Signed)
Smoking cessation recommended 

## 2021-01-23 NOTE — Progress Notes (Signed)
  Progress Note    Erika Thomas   [id number]:[id number]  DOB: Feb 22, 1981  DOA: 01/20/2021     2 Date of Service: 01/23/2021     Brief summary: 40 year old female with medical history significant for asthma versus?  COPD (per mother, no asthma but told to have COPD), obesity, presented to the ED with progressive dyspnea, wheezing, cough, right lower chest pain after exposed to her daughter who had similar respiratory illness earlier in the week.  Influenza A positive.  Admitted to ICU for acute respiratory failure with hypoxia/hypercapnia due to influenza A acute bronchitis and asthma/COPD exacerbation.  Deteriorated overnight of admission with anxiety, agitation, received Ativan and then Precedex and placed on BiPAP for a few hours.  Despite this patient with tachypnea, tiring, ABG shows acute respiratory acidosis.  PCCM consulted 10/24 for likely intubation and mechanical ventilation.  However patient/family declined intubation.  Patient was managed closely with CAT x4 hours, gradually improved and did not require intubation.         Assessment and Plan * Acute respiratory failure with hypoxia and hypercapnia (HCC) Due to influenza.  Influenza A - Continue Tamiflu  COPD with acute exacerbation (HCC) - Continue Solu-Medrol, bronchodilators  Iron deficiency anemia, unspecified - Start iron - Outpatient GI follow-up  Hypocalcemia Resolved  Smoking - Smoking cessation recommended  Acute metabolic encephalopathy Resolved     Subjective:  Clinically improving.  No fever, confusion, chest pain.  Still on oxygen but, probably able to wean off today.  No hemoptysis.  Objective Vitals:   01/23/21 1600 01/23/21 1631 01/23/21 1700 01/23/21 1800  BP:  (!) 97/50 120/80   Pulse: 94 95 100 88  Resp: 20 (!) 27 (!) 23 (!) 32  Temp: 98.2 F (36.8 C)     TempSrc: Oral     SpO2: 95% 96% 97% 97%  Weight:      Height:       83.6 kg  Vital signs were reviewed and unremarkable  except for: Increased respiratory rate and Increased heart rate, still on oxygen   Exam General appearance: Adult female, lying in bed, sleeping, easily arousable, interactive.     HEENT: Anicteric, conjunctive are pink, lids and lashes normal.  No nasal deformity, discharge, epistaxis, oropharynx moist, no oral lesions, urine normal Skin: Normal skin, no suspicious rashes or lesions Cardiac: Slight tachycardia, no murmurs, S1 and S2 normal, JVP normal, no lower extremity edema Respiratory: Increased respiratory rate, no wheezing, no rales. Abdomen: Abdomen soft no tenderness palpation or guarding, no ascites or distention MSK: Normal muscle bulk and tone, no effusions or deformities. Neuro: Awake and alert, extraocular movements intact, face symmetric, speech fluent Psych: Attention normal, affect normal, judgment insight appear normal    Labs / Other Information My review of labs, imaging, notes and other tests is significant for White blood cell count up, iron deficiency, hemoglobin stable     Disposition Plan: Status is: Inpatient  Remains inpatient appropriate because: Oxygen which is not her baseline.        Time spent: 35 minutes minutes [facility] 01/23/2021, 7:14 PM

## 2021-01-23 NOTE — Telephone Encounter (Signed)
Pt still admitted and unsure of her vaccine status so will call her once d/c to discuss PFT protocol when comes to covid testing and then will schedule appts.

## 2021-01-23 NOTE — Assessment & Plan Note (Signed)
Resolved

## 2021-01-23 NOTE — Hospital Course (Addendum)
Mrs. Kosiba is a 40 y.o. F with obesity, hx smoking who presented to the ER with few days dyspnea, wheezing, cough, right lower chest pain after exposed to her daughter who had similar respiratory illness earlier in the week.    In the ER, influenza A positive.      10/23 Admitted to ICU on BiPAP 10/24 PCCM consulted overnight first night for worsening respiratory acidosis despite BiPAP, family and patient declined intubation, got CAT x4 and gradually improved.  10/26 weaned off O2

## 2021-01-23 NOTE — Assessment & Plan Note (Signed)
-   Continue Solu-Medrol, bronchodilators

## 2021-01-23 NOTE — Telephone Encounter (Signed)
Patient was seen by Dr. Chestine Spore for asthma exacerbation, acute hypoxic respiratory failure in the setting of influenza A and background smoking.  She will need hospital follow up with Dr. Celine Mans in 6 weeks with PFT's.  She does not have insurance and we are working on getting her established with the [employer].  Call with questions.     Erika Brim, MSN, APRN, NP-C, AGACNP-BC [facility] 01/23/2021, 12:34 PM   Please see [email or web address] for pager details.   From 7A-7P if no response, please call [phone] After hours, please call ELink [phone]

## 2021-01-24 DIAGNOSIS — E876 Hypokalemia: Secondary | ICD-10-CM | POA: Diagnosis present

## 2021-01-24 LAB — BASIC METABOLIC PANEL
Anion gap: 8 (ref 5–15)
BUN: 12 mg/dL (ref 6–20)
CO2: 29 mmol/L (ref 22–32)
Calcium: 9.3 mg/dL (ref 8.9–10.3)
Chloride: 100 mmol/L (ref 98–111)
Creatinine, Ser: 0.8 mg/dL (ref 0.44–1.00)
GFR, Estimated: >60 mL/min (ref >60)
Glucose, Bld: 121 mg/dL — ABNORMAL HIGH (ref 70–99)
Potassium: 3 mmol/L — ABNORMAL LOW (ref 3.5–5.1)
Sodium: 137 mmol/L (ref 135–145)

## 2021-01-24 LAB — CBC
HCT: 33.4 % — ABNORMAL LOW (ref 36.0–46.0)
Hemoglobin: 10.2 g/dL — ABNORMAL LOW (ref 12.0–15.0)
MCH: 20.6 pg — ABNORMAL LOW (ref 26.0–34.0)
MCHC: 30.5 g/dL (ref 30.0–36.0)
MCV: 67.3 fL — ABNORMAL LOW (ref 80.0–100.0)
Platelets: 358 10*3/uL (ref 150–400)
RBC: 4.96 MIL/uL (ref 3.87–5.11)
RDW: 18.6 % — ABNORMAL HIGH (ref 11.5–15.5)
WBC: 15.7 10*3/uL — ABNORMAL HIGH (ref 4.0–10.5)
nRBC: 0 % (ref 0.0–0.2)

## 2021-01-24 LAB — MAGNESIUM: Magnesium: 1.8 mg/dL (ref 1.7–2.4)

## 2021-01-24 MED ORDER — POTASSIUM CHLORIDE CRYS ER 20 MEQ PO TBCR
40.0000 meq | EXTENDED_RELEASE_TABLET | Freq: Two times a day (BID) | ORAL | Status: AC
  Administered 2021-01-24 (×2): 40 meq via ORAL
  Filled 2021-01-24 (×2): qty 2

## 2021-01-24 MED ORDER — POTASSIUM CHLORIDE 20 MEQ PO PACK
40.0000 meq | PACK | Freq: Once | ORAL | Status: AC
  Administered 2021-01-24: 40 meq via ORAL
  Filled 2021-01-24: qty 2

## 2021-01-24 MED ORDER — PREDNISONE 20 MG PO TABS
40.0000 mg | ORAL_TABLET | Freq: Every day | ORAL | Status: DC
  Administered 2021-01-24: 40 mg via ORAL
  Filled 2021-01-24: qty 2

## 2021-01-24 NOTE — Assessment & Plan Note (Signed)
Resolved with supplementation and starting spironolactone. 

## 2021-01-24 NOTE — Assessment & Plan Note (Addendum)
There was a question of asthma, although this has been ruled out. Instead, it is suspected that (given her smoking history) she has some undiagnosed COPD - Continue steoids, transition to prendisone -Continue bronchodilators -Continue Tamiflu   - Outpatient PFTs

## 2021-01-24 NOTE — Assessment & Plan Note (Addendum)
Admitted with tahcypnea, dyspnea, SpO2 84% in the ER, required BiPAP and nearly intubated.    Due to influenza.

## 2021-01-24 NOTE — Progress Notes (Signed)
  Progress Note    Erika Thomas   [id number]  DOB: 1980/10/07  DOA: 01/20/2021     3 Date of Service: 01/24/2021     Brief summary: Erika Thomas is a 40 y.o. F with obesity, hx smoking who presented to the ER with few days dyspnea, wheezing, cough, right lower chest pain after exposed to her daughter who had similar respiratory illness earlier in the week.    In the ER, influenza A positive.      10/23 Admitted to ICU on BiPAP 10/24 PCCM consulted overnight first night for worsening respiratory acidosis despite BiPAP, family and patient declined intubation, got CAT x4 and gradually improved.  10/26 weaned off O2          Assessment and Plan * Acute respiratory failure with hypoxia and hypercapnia (HCC) Admitted with tahcypnea, dyspnea, SpO2 84% in the ER, required BiPAP and nearly intubated.    Due to influenza.  Influenza A - Continue Tamiflu  COPD with acute exacerbation (HCC) There was a question of asthma, although this has been ruled out. Instead, it is suspected that (given her smoking history) she has some undiagnosed COPD - Continue steoids, transition to prendisone -Continue bronchodilators -Continue Tamiflu   - Outpatient PFTs  Iron deficiency anemia, unspecified - Start iron - Outpatient GI follow-up  Hypokalemia - Supplement K     Subjective:  Still tachycardic and tachypneic.  Still fairly out of breath and very short of breath with walking just a little ways.  Still with a cough, nonproductive.  No hemoptysis, fever, confusion. Called to mother, number not in service  Objective Vitals:   01/24/21 1400 01/24/21 1500 01/24/21 1600 01/24/21 1608  BP:      Pulse: (!) 101 90 (!) 102   Resp: (!) 27 (!) 28 (!) 31   Temp:    98.1 F (36.7 C)  TempSrc:    Axillary  SpO2: 97% 99% 98%   Weight:      Height:       83.6 kg  Vital signs were reviewed and unremarkable except for: Tachypnea into the mid 20s, tachycardia to the low  100s   Exam General appearance: Adult female, lying in bed, no acute distress, interactive     HEENT: Anicteric, conjunctival pink, lids and lashes normal.  No nasal deformity, discharge, epistaxis. Skin: Skin warm and dry, no suspicious rashes or lesions Cardiac: Tachycardic, regular, soft systolic murmur, no lower extremity edema, JVP not Respiratory: Increased respiratory rate, no wheezing in the posterior fields, scant wheezing in the anterior fields, no rales anywhere Abdomen: Abdomen soft without tenderness palpation or guarding, no ascites or distention MSK: Normal muscle bulk and tone Neuro:, Responding to questions, extraocular movements intact, face symmetric, speech fluent, upper extremity strength normal Psych: Attention normal, affect appropriate, judgment insight appear normal    Labs / Other Information My review of labs, imaging, notes and other tests is significant for Low potassium, white count improving     Disposition Plan: Status is: Inpatient  Remains inpatient appropriate because: Cardiac and tachypneic, dyspneic with minimal exertion.  We will transition to prednisone today, continue bronchodilators, continue Tamiflu.  If she remains stable off oxygen tomorrow continues to improve in her functional status, possibly home tomorrow        Time spent: 35 minutes [facility] 01/24/2021, 4:56 PM

## 2021-01-24 NOTE — Assessment & Plan Note (Signed)
Continue Tamiflu 

## 2021-01-24 NOTE — Assessment & Plan Note (Signed)
-   Start iron - Outpatient GI follow-up

## 2021-01-24 NOTE — Progress Notes (Signed)
Pt lost all IV access overnight, and is refusing IV insertion to this RN. Dr Maryfrances Bunnell notified by this RN, and gave verbal order to hold off on IV access. Dr Maryfrances Bunnell wishes to assess the pt at bedside. Meds have been changed to PO by MD. This RN will continue to carefully monitor patient and need for IV access.

## 2021-01-25 DIAGNOSIS — E876 Hypokalemia: Secondary | ICD-10-CM

## 2021-01-25 LAB — BASIC METABOLIC PANEL
Anion gap: 8 (ref 5–15)
BUN: 14 mg/dL (ref 6–20)
CO2: 26 mmol/L (ref 22–32)
Calcium: 9.2 mg/dL (ref 8.9–10.3)
Chloride: 103 mmol/L (ref 98–111)
Creatinine, Ser: 0.75 mg/dL (ref 0.44–1.00)
GFR, Estimated: >60 mL/min (ref >60)
Glucose, Bld: 107 mg/dL — ABNORMAL HIGH (ref 70–99)
Potassium: 3.5 mmol/L (ref 3.5–5.1)
Sodium: 137 mmol/L (ref 135–145)

## 2021-01-25 MED ORDER — MOMETASONE FURO-FORMOTEROL FUM 100-5 MCG/ACT IN AERO: 2.0000 | INHALATION_SPRAY | Freq: Two times a day (BID) | RESPIRATORY_TRACT | Status: AC

## 2021-01-25 MED ORDER — PHENOL 1.4 % MT LIQD
1.0000 | OROMUCOSAL | Status: DC | PRN
  Administered 2021-01-25: 1 via OROMUCOSAL
  Filled 2021-01-25: qty 177

## 2021-01-25 MED ORDER — FERROUS SULFATE 325 (65 FE) MG PO TABS: 325.0000 mg | ORAL_TABLET | Freq: Two times a day (BID) | ORAL | 3 refills | Status: AC

## 2021-01-25 MED ORDER — ALBUTEROL SULFATE (2.5 MG/3ML) 0.083% IN NEBU: 2.5000 mg | INHALATION_SOLUTION | RESPIRATORY_TRACT | 12 refills | Status: AC | PRN

## 2021-01-25 MED ORDER — OSELTAMIVIR PHOSPHATE 75 MG PO CAPS
75.0000 mg | ORAL_CAPSULE | ORAL | Status: AC
  Administered 2021-01-25: 75 mg via ORAL
  Filled 2021-01-25: qty 1

## 2021-01-25 MED ORDER — CITALOPRAM HYDROBROMIDE 40 MG PO TABS: 40.0000 mg | ORAL_TABLET | Freq: Every day | ORAL | 6 refills | Status: DC

## 2021-01-25 MED ORDER — BENZONATATE 200 MG PO CAPS: 200.0000 mg | ORAL_CAPSULE | Freq: Three times a day (TID) | ORAL | 0 refills | Status: DC | PRN

## 2021-01-25 NOTE — Assessment & Plan Note (Signed)
-   Supplemented and resolved 

## 2021-01-25 NOTE — Assessment & Plan Note (Signed)
Completed 5 days oseltamivir

## 2021-01-25 NOTE — Progress Notes (Signed)
Pt given discharge instructions and instructed to call doctor for follow-up appointment. Patient indicates understanding. Escorted patient to main entrance.

## 2021-01-25 NOTE — Discharge Summary (Signed)
Physician Discharge Summary   Patient name: Erika Thomas  Admit date:     01/20/2021  Discharge date: 01/25/2021  Discharge Physician: Alberteen Sam   PCP: [facility]   Recommendations at discharge:  Follow up with [facility]: Please refer to GI or obtain serial FOBT to evaluate iron deficiency anemia [facility]: Please repeat Hgb and iron studies in 3-4 months Follow up with Pulmonology Dr. Celine Mans in 6 weeks Dr. Celine Mans: Please obtain spirometry or PFTs as able      Discharge Diagnoses Principal Problem:   Acute respiratory failure with hypoxia and hypercapnia (HCC) Active Problems:   COPD with acute exacerbation (HCC)   Influenza A   Iron deficiency anemia, unspecified   Hypocalcemia   Smoking   Acute metabolic encephalopathy   Hypokalemia     Hospital Course   Erika Thomas is a 40 y.o. F with obesity, hx smoking who presented to the ER with few days dyspnea, wheezing, cough, right lower chest pain after exposed to her daughter who had similar respiratory illness earlier in the week.    In the ER, influenza A positive.      10/23 Admitted to ICU on BiPAP 10/24 PCCM consulted overnight first night for worsening respiratory acidosis despite BiPAP, family and patient declined intubation, got CAT x4 and gradually improved.  10/26 weaned off O2    Principal discharge diagnosis: * Acute respiratory failure with hypoxia and hypercapnia (HCC) Admitted with tahcypnea, dyspnea, SpO2 84% in the ER, required BiPAP and nearly intubated.    Due to influenza.    Other diagnoses: Influenza A Completed 5 days oseltamivir  COPD with acute exacerbation (HCC) There was some initial report she may have asthma, although she denies this, this has been ruled out.  Instead, it is suspected that (given her smoking history) she has some undiagnosed COPD  Treated with 5 days steroids.  Discharged with supply of albuterol, Dulera.  Has Pulmonology follow up  pending.    - Recommend outpatient PFTs  - Smoking cessation recommended, modalities discussed  Iron deficiency anemia, unspecified Started on iron  Likely this is menstrual loss, but recommend outpatient GI follow up or at least serial FOBT as an outpatient depending on cost/insurance  Hypocalcemia Resolved  Smoking Smoking cessation recommended, modalities discussed.    Acute metabolic encephalopathy Due to respiratory failure.  Resolved  Hypokalemia Supplemented and resolved.         Condition at discharge: good  Exam Physical Exam Constitutional:      General: She is not in acute distress.    Appearance: She is well-developed. She is obese. She is not ill-appearing or toxic-appearing.  Cardiovascular:     Rate and Rhythm: Normal rate and regular rhythm.     Heart sounds: No murmur heard.   No gallop.  Pulmonary:     Effort: No tachypnea or respiratory distress.     Breath sounds: Decreased breath sounds present. No wheezing or rales.  Abdominal:     Palpations: Abdomen is soft.     Tenderness: There is no abdominal tenderness. There is no guarding.  Skin:    General: Skin is warm and dry.  Neurological:     General: No focal deficit present.     Mental Status: She is alert and oriented to person, place, and time.     Motor: No weakness.  Psychiatric:        Mood and Affect: Mood normal.  Behavior: Behavior normal.      Disposition: Home  Discharge time: greater than 30 minutes.  Follow-up Information     [facility]. Schedule an appointment as soon as possible for a visit.   Why: Call for an appointment, they will work with you on cost & access to care. Contact information: [address] [place] [address] [place] [address] [phone]        Erika Holler, MD. Go in 6 week(s).   Specialty: Pulmonary Disease Why: Call to confirm follow up appointment Contact information: [address]  [place] [address] [phone]                 Allergies as of 01/25/2021   No Known Allergies      Medication List     STOP taking these medications    methocarbamol 500 MG tablet Commonly known as: ROBAXIN       TAKE these medications    albuterol (2.5 MG/3ML) 0.083% nebulizer solution Commonly known as: PROVENTIL Take 3 mLs (2.5 mg total) by nebulization every 2 (two) hours as needed for wheezing or shortness of breath.   benzonatate 200 MG capsule Commonly known as: TESSALON Take 1 capsule (200 mg total) by mouth 3 (three) times daily as needed for cough.   citalopram 40 MG tablet Commonly known as: CeleXA Take 1 tablet (40 mg total) by mouth daily.   ferrous sulfate 325 (65 FE) MG tablet Take 1 tablet (325 mg total) by mouth 2 (two) times daily with a meal.   mometasone-formoterol 100-5 MCG/ACT Aero Commonly known as: DULERA Inhale 2 puffs into the lungs 2 (two) times daily.   TYLENOL EXTRA STRENGTH PO Take 1,300 mg by mouth every 8 (eight) hours as needed (headache).        DG CHEST PORT 1 VIEW  Result Date: 01/21/2021 CLINICAL DATA:  Acute respiratory failure with hypoxia. EXAM: PORTABLE CHEST 1 VIEW COMPARISON:  January 20, 2021. FINDINGS: The heart size and mediastinal contours are within normal limits. Both lungs are clear. The visualized skeletal structures are unremarkable. IMPRESSION: No active disease. Electronically Signed   By: Lupita Raider M.D.   On: 01/21/2021 08:31   DG Chest Port 1 View  Result Date: 01/20/2021 CLINICAL DATA:  Shortness of breath for 2 days in a 40 year old female. EXAM: PORTABLE CHEST 1 VIEW COMPARISON:  Aug 02, 2020. FINDINGS: EKG leads project over the chest. Trachea midline. Cardiomediastinal contours and hilar structures are normal. Lungs are clear. No sign of effusion on frontal radiograph. On limited assessment there is no acute skeletal process. IMPRESSION: No acute cardiopulmonary disease.  Electronically Signed   By: Donzetta Kohut M.D.   On: 01/20/2021 10:18   Results for orders placed or performed during the hospital encounter of 01/20/21  Resp Panel by RT-PCR (Flu A&B, Covid) Nasopharyngeal Swab     Status: Abnormal   Collection Time: 01/20/21 10:34 AM   Specimen: Nasopharyngeal Swab; Nasopharyngeal(NP) swabs in vial transport medium  Result Value Ref Range Status   SARS Coronavirus 2 by RT PCR NEGATIVE NEGATIVE Final    Comment: (NOTE) SARS-CoV-2 target nucleic acids are NOT DETECTED.  The SARS-CoV-2 RNA is generally detectable in upper respiratory specimens during the acute phase of infection. The lowest concentration of SARS-CoV-2 viral copies this assay can detect is 138 copies/mL. A negative result does not preclude SARS-Cov-2 infection and should not be used as the sole basis for treatment or other patient  management decisions. A negative result may occur with  improper specimen collection/handling, submission of specimen other than nasopharyngeal swab, presence of viral mutation(s) within the areas targeted by this assay, and inadequate number of viral copies(<138 copies/mL). A negative result must be combined with clinical observations, patient history, and epidemiological information. The expected result is Negative.  Fact Sheet for Patients:  BloggerCourse.com  Fact Sheet for Healthcare Providers:  SeriousBroker.it  This test is no t yet approved or cleared by the [place] FDA and  has been authorized for detection and/or diagnosis of SARS-CoV-2 by FDA under an Emergency Use Authorization (EUA). This EUA will remain  in effect (meaning this test can be used) for the duration of the COVID-19 declaration under Section 564(b)(1) of the Act, 21 U.S.C.section 360bbb-3(b)(1), unless the authorization is terminated  or revoked sooner.       Influenza A by PCR POSITIVE (A) NEGATIVE Final   Influenza B by  PCR NEGATIVE NEGATIVE Final    Comment: (NOTE) The Xpert Xpress SARS-CoV-2/FLU/RSV plus assay is intended as an aid in the diagnosis of influenza from Nasopharyngeal swab specimens and should not be used as a sole basis for treatment. Nasal washings and aspirates are unacceptable for Xpert Xpress SARS-CoV-2/FLU/RSV testing.  Fact Sheet for Patients: BloggerCourse.com  Fact Sheet for Healthcare Providers: SeriousBroker.it  This test is not yet approved or cleared by the [place] FDA and has been authorized for detection and/or diagnosis of SARS-CoV-2 by FDA under an Emergency Use Authorization (EUA). This EUA will remain in effect (meaning this test can be used) for the duration of the COVID-19 declaration under Section 564(b)(1) of the Act, 21 U.S.C. section 360bbb-3(b)(1), unless the authorization is terminated or revoked.  Performed at [facility], [address] [place], [place] [address]   MRSA Next Gen by PCR, Nasal     Status: Abnormal   Collection Time: 01/20/21  5:59 PM   Specimen: Nasal Mucosa; Nasal Swab  Result Value Ref Range Status   MRSA by PCR Next Gen DETECTED (A) NOT DETECTED Final    Comment: RESULT CALLED TO, READ BACK BY AND VERIFIED WITH: Cherrie Gauze RN @2022  ON 01/20/21 JACKSON,K (NOTE) The GeneXpert MRSA Assay (FDA approved for NASAL specimens only), is one component of a comprehensive MRSA colonization surveillance program. It is not intended to diagnose MRSA infection nor to guide or monitor treatment for MRSA infections. Test performance is not FDA approved in patients less than 78 years old. Performed at [facility], [address], [place], [place]     Signed:  [id number] MD.  [facility] 01/25/2021, 12:34 PM

## 2021-01-25 NOTE — Assessment & Plan Note (Signed)
Due to respiratory failure.  Resolved

## 2021-01-25 NOTE — Assessment & Plan Note (Signed)
There was some initial report she may have asthma, although she denies this, this has been ruled out.  Instead, it is suspected that (given her smoking history) she has some undiagnosed COPD  Treated with 5 days steroids.  Discharged with supply of albuterol, Dulera.  Has Pulmonology follow up pending.    - Recommend outpatient PFTs  - Smoking cessation recommended, modalities discussed

## 2021-01-25 NOTE — Assessment & Plan Note (Signed)
Smoking cessation recommended, modalities discussed 

## 2021-01-25 NOTE — Assessment & Plan Note (Signed)
Resolved

## 2021-01-25 NOTE — Assessment & Plan Note (Signed)
Admitted with tahcypnea, dyspnea, SpO2 84% in the ER, required BiPAP and nearly intubated.    Due to influenza. 

## 2021-01-25 NOTE — Assessment & Plan Note (Signed)
Started on iron  Likely this is menstrual loss, but recommend outpatient GI follow up or at least serial FOBT as an outpatient depending on cost/insurance

## 2021-05-09 ENCOUNTER — Encounter (HOSPITAL_COMMUNITY): Payer: Self-pay | Admitting: Obstetrics and Gynecology

## 2021-05-09 ENCOUNTER — Inpatient Hospital Stay (HOSPITAL_COMMUNITY)
Admission: AD | Admit: 2021-05-09 | Discharge: 2021-05-09 | Disposition: A | Discharge status: Home / Self Care | Payer: Medicaid Other | Attending: Obstetrics and Gynecology | Admitting: Obstetrics and Gynecology

## 2021-05-09 ENCOUNTER — Other Ambulatory Visit: Payer: Self-pay

## 2021-05-09 ENCOUNTER — Inpatient Hospital Stay (HOSPITAL_COMMUNITY): Payer: Medicaid Other

## 2021-05-09 DIAGNOSIS — Z3A01 Less than 8 weeks gestation of pregnancy: Secondary | ICD-10-CM | POA: Diagnosis not present

## 2021-05-09 DIAGNOSIS — O98311 Other infections with a predominantly sexual mode of transmission complicating pregnancy, first trimester: Secondary | ICD-10-CM | POA: Diagnosis not present

## 2021-05-09 DIAGNOSIS — Z79899 Other long term (current) drug therapy: Secondary | ICD-10-CM | POA: Diagnosis not present

## 2021-05-09 DIAGNOSIS — A5901 Trichomonal vulvovaginitis: Secondary | ICD-10-CM | POA: Diagnosis not present

## 2021-05-09 DIAGNOSIS — O209 Hemorrhage in early pregnancy, unspecified: Secondary | ICD-10-CM | POA: Diagnosis not present

## 2021-05-09 DIAGNOSIS — O3680X0 Pregnancy with inconclusive fetal viability, not applicable or unspecified: Secondary | ICD-10-CM

## 2021-05-09 DIAGNOSIS — Z7951 Long term (current) use of inhaled steroids: Secondary | ICD-10-CM | POA: Insufficient documentation

## 2021-05-09 DIAGNOSIS — Z3A00 Weeks of gestation of pregnancy not specified: Secondary | ICD-10-CM | POA: Diagnosis not present

## 2021-05-09 DIAGNOSIS — A599 Trichomoniasis, unspecified: Secondary | ICD-10-CM

## 2021-05-09 LAB — COMPREHENSIVE METABOLIC PANEL
ALT: 23 U/L (ref 0–44)
AST: 24 U/L (ref 15–41)
Albumin: 3.4 g/dL — ABNORMAL LOW (ref 3.5–5.0)
Alkaline Phosphatase: 46 U/L (ref 38–126)
Anion gap: 8 (ref 5–15)
BUN: 6 mg/dL (ref 6–20)
CO2: 24 mmol/L (ref 22–32)
Calcium: 8.7 mg/dL — ABNORMAL LOW (ref 8.9–10.3)
Chloride: 105 mmol/L (ref 98–111)
Creatinine, Ser: 0.76 mg/dL (ref 0.44–1.00)
GFR, Estimated: >60 mL/min (ref >60)
Glucose, Bld: 92 mg/dL (ref 70–99)
Potassium: 3.4 mmol/L — ABNORMAL LOW (ref 3.5–5.1)
Sodium: 137 mmol/L (ref 135–145)
Total Bilirubin: 0.3 mg/dL (ref 0.3–1.2)
Total Protein: 6.3 g/dL — ABNORMAL LOW (ref 6.5–8.1)

## 2021-05-09 LAB — URINALYSIS, ROUTINE W REFLEX MICROSCOPIC
Bilirubin Urine: NEGATIVE
Glucose, UA: NEGATIVE mg/dL
Ketones, ur: NEGATIVE mg/dL
Leukocytes,Ua: NEGATIVE
Nitrite: NEGATIVE
Protein, ur: NEGATIVE mg/dL
RBC / HPF: >50 RBC/hpf — ABNORMAL HIGH (ref 0–5)
Specific Gravity, Urine: 1.026 (ref 1.005–1.030)
pH: 5 (ref 5.0–8.0)

## 2021-05-09 LAB — CBC
HCT: 33.5 % — ABNORMAL LOW (ref 36.0–46.0)
Hemoglobin: 11.1 g/dL — ABNORMAL LOW (ref 12.0–15.0)
MCH: 23.8 pg — ABNORMAL LOW (ref 26.0–34.0)
MCHC: 33.1 g/dL (ref 30.0–36.0)
MCV: 71.9 fL — ABNORMAL LOW (ref 80.0–100.0)
Platelets: 423 10*3/uL — ABNORMAL HIGH (ref 150–400)
RBC: 4.66 MIL/uL (ref 3.87–5.11)
RDW: 18.6 % — ABNORMAL HIGH (ref 11.5–15.5)
WBC: 8.8 10*3/uL (ref 4.0–10.5)
nRBC: 0 % (ref 0.0–0.2)

## 2021-05-09 LAB — HCG, QUANTITATIVE, PREGNANCY: hCG, Beta Chain, Quant, S: 572 m[IU]/mL — ABNORMAL HIGH (ref <5)

## 2021-05-09 LAB — ABO/RH: ABO/RH(D): O POS

## 2021-05-09 LAB — POCT PREGNANCY, URINE: Preg Test, Ur: POSITIVE — AB

## 2021-05-09 MED ORDER — METRONIDAZOLE 500 MG PO TABS
2000.0000 mg | ORAL_TABLET | Freq: Once | ORAL | Status: AC
Start: 2021-05-09 — End: 2021-05-09
  Administered 2021-05-09: 2000 mg via ORAL
  Filled 2021-05-09: qty 4

## 2021-05-09 NOTE — MAU Note (Addendum)
...  Erika Thomas is a 41 y.o. at approx [redacted]w[redacted]d here in MAU reporting: Vaginal bleeding for two days. She states yesterday is when her bleeding increased but states "even then it wasn't a lot." She states she is not passing any blood clots and denies pain.   She states she had an ultrasound in [place] on the 7th with [facility] on a bus and they were unable to tell her how far along she was and stated she had "fluid" in her uterus but they were unable to tell her what it was.   She states she tested positive for Trich two weeks ago and has not been able to pick up her medications since. She denies any current vaginal itchihg, vaginal odors, or abnormal discharge.  She endorses using cocaine in the past week prior to finding out she was pregnant.  LMP: Around 03/26/2021  Pain score: No pain.  Lab orders placed from triage: UA, POCT Pregnancy

## 2021-05-09 NOTE — MAU Provider Note (Signed)
History     [id number]  Arrival date and time: 05/09/21 4098    Chief Complaint  Patient presents with   Vaginal Bleeding     HPI Erika Thomas is a 41 y.o. at [redacted]w[redacted]d by LMP, who presents for vaginal bleeding.   Patient reports she has had vaginal bleeding for two days No abdominal pain Last period around Christmas, intercourse about two weeks later Unplanned pregnancy Worried as she has never had something like this before Was also recently diagnosed with trichomonas at the [facility] Has not yet been able to pick up her treatment   --/--/O POS (02/09 0754)  OB History     Gravida  4   Para  3   Term  3   Preterm      AB      Living  3      SAB      IAB      Ectopic      Multiple      Live Births  3           Past Medical History:  Diagnosis Date   Asthma     Past Surgical History:  Procedure Laterality Date   CESAREAN SECTION      Family History  Problem Relation Age of Onset   Hypertension Other     Social History   Socioeconomic History   Marital status: Married    Spouse name: Not on file   Number of children: Not on file   Years of education: Not on file   Highest education level: Not on file  Occupational History   Not on file  Tobacco Use   Smoking status: Every Day    Packs/day: 1.00    Types: Cigarettes   Smokeless tobacco: Never  Vaping Use   Vaping Use: Never used  Substance and Sexual Activity   Alcohol use: Not Currently   Drug use: Yes    Types: Cocaine    Comment: last used cocaine in the last seven days as of 05/09/2021   Sexual activity: Not Currently  Other Topics Concern   Not on file  Social History Narrative   Not on file   Social Determinants of Health   Financial Resource Strain: Not on file  Food Insecurity: Not on file  Transportation Needs: Not on file  Physical Activity: Not on file  Stress: Not on file  Social Connections: Not on file  Intimate Partner Violence: Not on file    No Known  Allergies  No current facility-administered medications on file prior to encounter.   Current Outpatient Medications on File Prior to Encounter  Medication Sig Dispense Refill   Acetaminophen (TYLENOL EXTRA STRENGTH PO) Take 1,300 mg by mouth every 8 (eight) hours as needed (headache).     albuterol (PROVENTIL) (2.5 MG/3ML) 0.083% nebulizer solution Take 3 mLs (2.5 mg total) by nebulization every 2 (two) hours as needed for wheezing or shortness of breath. 75 mL 12   benzonatate (TESSALON) 200 MG capsule Take 1 capsule (200 mg total) by mouth 3 (three) times daily as needed for cough. 20 capsule 0   citalopram (CELEXA) 40 MG tablet Take 1 tablet (40 mg total) by mouth daily. 30 tablet 6   ferrous sulfate 325 (65 FE) MG tablet Take 1 tablet (325 mg total) by mouth 2 (two) times daily with a meal. 60 tablet 3   mometasone-formoterol (DULERA) 100-5 MCG/ACT AERO Inhale 2 puffs into the lungs 2 (two) times daily.  1 each      ROS Pertinent positives and negative per HPI, all others reviewed and negative  Physical Exam   BP 115/77 (BP Location: Left Arm)    Pulse 88    Temp 98 F (36.7 C) (Oral)    Resp 15    LMP 03/26/2021 (Approximate)    SpO2 100%   Patient Vitals for the past 24 hrs:  BP Temp Temp src Pulse Resp SpO2  05/09/21 1007 115/77 -- -- 88 15 100 %  05/09/21 0721 113/74 98 F (36.7 C) Oral 94 17 100 %    Physical Exam Vitals reviewed.  Constitutional:      General: She is not in acute distress.    Appearance: She is well-developed. She is not diaphoretic.  Eyes:     General: No scleral icterus. Pulmonary:     Effort: Pulmonary effort is normal. No respiratory distress.  Abdominal:     General: There is no distension.     Palpations: Abdomen is soft.     Tenderness: There is no abdominal tenderness. There is no guarding or rebound.  Skin:    General: Skin is warm and dry.  Neurological:     Mental Status: She is alert.     Coordination: Coordination normal.      Cervical Exam    Bedside Ultrasound Not done  My interpretation: n/a   Labs Results for orders placed or performed during the hospital encounter of 05/09/21 (from the past 24 hour(s))  Pregnancy, urine POC     Status: Abnormal   Collection Time: 05/09/21  7:24 AM  Result Value Ref Range   Preg Test, Ur POSITIVE (A) NEGATIVE  Urinalysis, Routine w reflex microscopic Urine, Clean Catch     Status: Abnormal   Collection Time: 05/09/21  7:30 AM  Result Value Ref Range   Color, Urine YELLOW YELLOW   APPearance HAZY (A) CLEAR   Specific Gravity, Urine 1.026 1.005 - 1.030   pH 5.0 5.0 - 8.0   Glucose, UA NEGATIVE NEGATIVE mg/dL   Hgb urine dipstick LARGE (A) NEGATIVE   Bilirubin Urine NEGATIVE NEGATIVE   Ketones, ur NEGATIVE NEGATIVE mg/dL   Protein, ur NEGATIVE NEGATIVE mg/dL   Nitrite NEGATIVE NEGATIVE   Leukocytes,Ua NEGATIVE NEGATIVE   RBC / HPF >50 (H) 0 - 5 RBC/hpf   WBC, UA 6-10 0 - 5 WBC/hpf   Bacteria, UA FEW (A) NONE SEEN   Squamous Epithelial / LPF 0-5 0 - 5   Mucus PRESENT   CBC     Status: Abnormal   Collection Time: 05/09/21  7:54 AM  Result Value Ref Range   WBC 8.8 4.0 - 10.5 K/uL   RBC 4.66 3.87 - 5.11 MIL/uL   Hemoglobin 11.1 (L) 12.0 - 15.0 g/dL   HCT 16.133.5 (L) 09.636.0 - 04.546.0 %   MCV 71.9 (L) 80.0 - 100.0 fL   MCH 23.8 (L) 26.0 - 34.0 pg   MCHC 33.1 30.0 - 36.0 g/dL   RDW 40.918.6 (H) 81.111.5 - 91.415.5 %   Platelets 423 (H) 150 - 400 K/uL   nRBC 0.0 0.0 - 0.2 %  Comprehensive metabolic panel     Status: Abnormal   Collection Time: 05/09/21  7:54 AM  Result Value Ref Range   Sodium 137 135 - 145 mmol/L   Potassium 3.4 (L) 3.5 - 5.1 mmol/L   Chloride 105 98 - 111 mmol/L   CO2 24 22 - 32 mmol/L   Glucose, Bld 92  70 - 99 mg/dL   BUN 6 6 - 20 mg/dL   Creatinine, Ser 0.53 0.44 - 1.00 mg/dL   Calcium 8.7 (L) 8.9 - 10.3 mg/dL   Total Protein 6.3 (L) 6.5 - 8.1 g/dL   Albumin 3.4 (L) 3.5 - 5.0 g/dL   AST 24 15 - 41 U/L   ALT 23 0 - 44 U/L   Alkaline Phosphatase  46 38 - 126 U/L   Total Bilirubin 0.3 0.3 - 1.2 mg/dL   GFR, Estimated >97 >67 mL/min   Anion gap 8 5 - 15  hCG, quantitative, pregnancy     Status: Abnormal   Collection Time: 05/09/21  7:54 AM  Result Value Ref Range   hCG, Beta Chain, Quant, S 572 (H) <5 mIU/mL  ABO/Rh     Status: None   Collection Time: 05/09/21  7:54 AM  Result Value Ref Range   ABO/RH(D) O POS    No rh immune globuloin      NOT A RH IMMUNE GLOBULIN CANDIDATE, PT RH POSITIVE Performed at [facility], [address], [place], [place] [address]     Imaging US OB LESS THAN 14 WEEKS WITH [place] TRANSVAGINAL  Result Date: 05/09/2021 CLINICAL DATA:  Vaginal bleeding in 1st trimester pregnancy. EXAM: OBSTETRIC <14 WK [place] AND TRANSVAGINAL OB US TECHNIQUE: Both transabdominal and transvaginal ultrasound examinations were performed for complete evaluation of the gestation as well as the maternal uterus, adnexal regions, and pelvic cul-de-sac. Transvaginal technique was performed to assess early pregnancy. COMPARISON:  None. FINDINGS: Intrauterine gestational sac: None Maternal uterus/adnexae: No fibroids identified. Both ovaries are normal in appearance. No evidence of adnexal mass or abnormal free fluid. IMPRESSION: Pregnancy of unknown anatomic location (no intrauterine gestational sac or adnexal mass identified). Differential diagnosis includes recent spontaneous abortion, IUP too early to visualize, and non-visualized ectopic pregnancy. Recommend correlation with serial beta-hCG levels, and follow up US if warranted clinically. Electronically Signed   By: Danae Orleans M.D.   On: 05/09/2021 09:29    MAU Course  Procedures Lab Orders         Urinalysis, Routine w reflex microscopic Urine, Clean Catch         CBC         Comprehensive metabolic panel         hCG, quantitative, pregnancy         Pregnancy, urine POC    Meds ordered this encounter  Medications   metroNIDAZOLE (FLAGYL) tablet 2,000 mg   Imaging Orders          US OB LESS THAN 14 WEEKS WITH OB TRANSVAGINAL     MDM moderate  Assessment and Plan  #Pregnancy of unknown location #[redacted] weeks gestation of pregnancy Discussed with patient that hcg is below discriminatory zone and at present she has PUL. Discussed differential of normal early pregnancy vs miscarriage vs ectopic. Reviewed ectopic precautions in detail. Discussed need to return in 2 days time for repeat hcg.   #Trichomonas infection Given 2g Flagyl. Advised to avoid intercourse x1 week until after both she and her partner are treated. Recommended TOC in 4-6 weeks.    Dispo: discharged to home in stable condition.   Venora Maples, MD/MPH 05/09/21 11:05 AM  Allergies as of 05/09/2021   No Known Allergies      Medication List     TAKE these medications    albuterol (2.5 MG/3ML) 0.083% nebulizer solution Commonly known as: PROVENTIL Take 3 mLs (2.5  mg total) by nebulization every 2 (two) hours as needed for wheezing or shortness of breath.   benzonatate 200 MG capsule Commonly known as: TESSALON Take 1 capsule (200 mg total) by mouth 3 (three) times daily as needed for cough.   citalopram 40 MG tablet Commonly known as: CeleXA Take 1 tablet (40 mg total) by mouth daily.   ferrous sulfate 325 (65 FE) MG tablet Take 1 tablet (325 mg total) by mouth 2 (two) times daily with a meal.   mometasone-formoterol 100-5 MCG/ACT Aero Commonly known as: DULERA Inhale 2 puffs into the lungs 2 (two) times daily.   TYLENOL EXTRA STRENGTH PO Take 1,300 mg by mouth every 8 (eight) hours as needed (headache).

## 2021-05-11 ENCOUNTER — Inpatient Hospital Stay (HOSPITAL_COMMUNITY)
Admission: AD | Admit: 2021-05-11 | Discharge: 2021-05-11 | Disposition: A | Discharge status: Home / Self Care | Payer: Medicaid Other | Attending: Obstetrics and Gynecology | Admitting: Obstetrics and Gynecology

## 2021-05-11 ENCOUNTER — Other Ambulatory Visit: Payer: Self-pay

## 2021-05-11 DIAGNOSIS — O3680X0 Pregnancy with inconclusive fetal viability, not applicable or unspecified: Secondary | ICD-10-CM | POA: Diagnosis not present

## 2021-05-11 DIAGNOSIS — O09521 Supervision of elderly multigravida, first trimester: Secondary | ICD-10-CM | POA: Insufficient documentation

## 2021-05-11 DIAGNOSIS — Z3A01 Less than 8 weeks gestation of pregnancy: Secondary | ICD-10-CM | POA: Diagnosis not present

## 2021-05-11 DIAGNOSIS — Z09 Encounter for follow-up examination after completed treatment for conditions other than malignant neoplasm: Secondary | ICD-10-CM

## 2021-05-11 LAB — HCG, QUANTITATIVE, PREGNANCY: hCG, Beta Chain, Quant, S: 602 m[IU]/mL — ABNORMAL HIGH (ref <5)

## 2021-05-11 NOTE — MAU Note (Signed)
Here for f/u blood work.  Is still having sporadic bleeding. Very light today.  Some pain yesterday, none today.  Confirmed was givne Flagyl when here on 2/9.

## 2021-05-11 NOTE — Discharge Instructions (Signed)

## 2021-05-11 NOTE — MAU Provider Note (Signed)
History   Chief Complaint:  Follow-up  Erika Thomas is  41 y.o. [phone] Patient's last menstrual period was 03/26/2021 (approximate).. Patient is here for follow up of quantitative HCG and ongoing surveillance of pregnancy status. She is [redacted]w[redacted]d weeks gestation  by LMP.    Since her last visit, the patient is without new complaint. The patient reports bleeding as  none now.  She denies any pain.  General ROS:  negative  Her previous Quantitative HCG values are:  2/9: 572  Physical Exam   Blood pressure 106/68, pulse (!) 103, temperature 98.2 F (36.8 C), temperature source Oral, resp. rate 18, last menstrual period 03/26/2021, SpO2 99 %.  Physical Exam Vitals and nursing note reviewed.  Constitutional:      General: She is not in acute distress.    Appearance: She is well-developed.  HENT:     Head: Normocephalic.  Eyes:     Pupils: Pupils are equal, round, and reactive to light.  Cardiovascular:     Rate and Rhythm: Normal rate.  Pulmonary:     Effort: Pulmonary effort is normal. No respiratory distress.  Abdominal:     Palpations: Abdomen is soft.     Tenderness: There is no abdominal tenderness.  Musculoskeletal:        General: Normal range of motion.     Cervical back: Normal range of motion.  Skin:    General: Skin is warm and dry.  Neurological:     Mental Status: She is alert and oriented to person, place, and time.  Psychiatric:        Behavior: Behavior normal.        Thought Content: Thought content normal.        Judgment: Judgment normal.     Labs: Results for orders placed or performed during the hospital encounter of 05/11/21 (from the past 24 hour(s))  hCG, quantitative, pregnancy   Collection Time: 05/11/21 12:10 PM  Result Value Ref Range   hCG, Beta Chain, Quant, S 602 (H) <5 mIU/mL    Assessment:   1. Pregnancy of unknown anatomic location   2. [redacted] weeks gestation of pregnancy   3. Follow-up exam     Reviewed abnormal rise in HCG with  patient and discussed that this could be abnormally developing pregnancy vs ectopic. Discussed close follow up with repeat HCG on Monday. Will bring patient back to MAU for repeat labs as there is no outpatient availability and if HCG dose not rise appropriately, she will need treatment for suspected ectopic pregnancy. Patient verbalized understanding.   Plan: -Discharge home in stable condition -Strict ectopic precautions discussed -Patient advised to follow-up with MAU on 2/13 for repeat labs -Patient may return to MAU as needed or if her condition were to change or worsen  Rolm Bookbinder, CNM 05/11/2021, 2:06 PM

## 2021-05-13 ENCOUNTER — Inpatient Hospital Stay (HOSPITAL_COMMUNITY)
Admission: AD | Admit: 2021-05-13 | Discharge: 2021-05-13 | Disposition: A | Discharge status: Home / Self Care | Payer: Medicaid Other | Attending: Family Medicine | Admitting: Family Medicine

## 2021-05-13 ENCOUNTER — Other Ambulatory Visit (HOSPITAL_COMMUNITY)
Admit: 2021-05-13 | Discharge: 2021-05-13 | Disposition: A | Discharge status: Home / Self Care | Payer: Medicaid Other | Attending: Obstetrics & Gynecology | Admitting: Obstetrics & Gynecology

## 2021-05-13 ENCOUNTER — Other Ambulatory Visit: Payer: Self-pay

## 2021-05-13 DIAGNOSIS — O081 Delayed or excessive hemorrhage following ectopic and molar pregnancy: Secondary | ICD-10-CM | POA: Diagnosis not present

## 2021-05-13 DIAGNOSIS — O09521 Supervision of elderly multigravida, first trimester: Secondary | ICD-10-CM | POA: Diagnosis not present

## 2021-05-13 DIAGNOSIS — Z3A01 Less than 8 weeks gestation of pregnancy: Secondary | ICD-10-CM | POA: Diagnosis not present

## 2021-05-13 DIAGNOSIS — O021 Missed abortion: Secondary | ICD-10-CM | POA: Insufficient documentation

## 2021-05-13 LAB — COMPREHENSIVE METABOLIC PANEL
ALT: 21 U/L (ref 0–44)
AST: 23 U/L (ref 15–41)
Albumin: 3.5 g/dL (ref 3.5–5.0)
Alkaline Phosphatase: 49 U/L (ref 38–126)
Anion gap: 8 (ref 5–15)
BUN: 8 mg/dL (ref 6–20)
CO2: 21 mmol/L — ABNORMAL LOW (ref 22–32)
Calcium: 8.4 mg/dL — ABNORMAL LOW (ref 8.9–10.3)
Chloride: 106 mmol/L (ref 98–111)
Creatinine, Ser: 0.77 mg/dL (ref 0.44–1.00)
GFR, Estimated: >60 mL/min (ref >60)
Glucose, Bld: 114 mg/dL — ABNORMAL HIGH (ref 70–99)
Potassium: 3.4 mmol/L — ABNORMAL LOW (ref 3.5–5.1)
Sodium: 135 mmol/L (ref 135–145)
Total Bilirubin: 0.4 mg/dL (ref 0.3–1.2)
Total Protein: 6.3 g/dL — ABNORMAL LOW (ref 6.5–8.1)

## 2021-05-13 LAB — CBC
HCT: 34.8 % — ABNORMAL LOW (ref 36.0–46.0)
Hemoglobin: 11.3 g/dL — ABNORMAL LOW (ref 12.0–15.0)
MCH: 23.8 pg — ABNORMAL LOW (ref 26.0–34.0)
MCHC: 32.5 g/dL (ref 30.0–36.0)
MCV: 73.3 fL — ABNORMAL LOW (ref 80.0–100.0)
Platelets: 416 10*3/uL — ABNORMAL HIGH (ref 150–400)
RBC: 4.75 MIL/uL (ref 3.87–5.11)
RDW: 18.4 % — ABNORMAL HIGH (ref 11.5–15.5)
WBC: 9.9 10*3/uL (ref 4.0–10.5)
nRBC: 0 % (ref 0.0–0.2)

## 2021-05-13 LAB — HCG, QUANTITATIVE, PREGNANCY: hCG, Beta Chain, Quant, S: 545 m[IU]/mL — ABNORMAL HIGH (ref <5)

## 2021-05-13 MED ORDER — OXYCODONE-ACETAMINOPHEN 5-325 MG PO TABS: 1.0000 | ORAL_TABLET | Freq: Four times a day (QID) | ORAL | 0 refills | Status: DC | PRN

## 2021-05-13 MED ORDER — OXYCODONE-ACETAMINOPHEN 5-325 MG PO TABS
1.0000 | ORAL_TABLET | Freq: Four times a day (QID) | ORAL | 0 refills | Status: DC | PRN
Start: 2021-05-13 — End: 2022-02-10

## 2021-05-13 MED ORDER — MISOPROSTOL 200 MCG PO TABS
800.0000 ug | ORAL_TABLET | Freq: Once | ORAL | Status: AC
  Administered 2021-05-13: 800 ug via ORAL
  Filled 2021-05-13: qty 4

## 2021-05-13 NOTE — MAU Provider Note (Addendum)
Event Date/Time   First Provider Initiated Contact with Patient 05/13/21 1500      S Ms. Erika Thomas is a 41 y.o. [phone] patient who presents to MAU today with complaint of vaginal bleeding, which started few days ago. Bleeding is intermittent. Denies pain. She is hear for repeat hormone level.  HCG levels:  2/9 -  572  2/11 - 602   O BP 117/85 (BP Location: Right Arm)    Pulse 98    Temp 98.2 F (36.8 C) (Oral)    Resp 16    Ht 5\' 4"  (1.626 m)    Wt 87 kg    LMP 03/26/2021 (Approximate)    SpO2 100% Comment: room air   BMI 32.94 kg/m  Physical Exam Vitals reviewed.  Constitutional:      Appearance: Normal appearance.  Cardiovascular:     Rate and Rhythm: Normal rate and regular rhythm.  Pulmonary:     Effort: Pulmonary effort is normal.  Abdominal:     General: Abdomen is flat. There is no distension.     Palpations: Abdomen is soft. There is no mass.     Tenderness: There is no abdominal tenderness. There is no guarding.     Hernia: No hernia is present.  Neurological:     Mental Status: She is alert.  Psychiatric:        Mood and Affect: Mood normal.        Behavior: Behavior normal.        Thought Content: Thought content normal.        Judgment: Judgment normal.   Results for orders placed or performed during the hospital encounter of 05/13/21 (from the past 24 hour(s))  hCG, quantitative, pregnancy     Status: Abnormal   Collection Time: 05/13/21  2:53 PM  Result Value Ref Range   hCG, Beta Chain, Quant, S 545 (H) <5 mIU/mL  CBC     Status: Abnormal   Collection Time: 05/13/21  2:53 PM  Result Value Ref Range   WBC 9.9 4.0 - 10.5 K/uL   RBC 4.75 3.87 - 5.11 MIL/uL   Hemoglobin 11.3 (L) 12.0 - 15.0 g/dL   HCT 05/15/21 (L) 98.2 - 64.1 %   MCV 73.3 (L) 80.0 - 100.0 fL   MCH 23.8 (L) 26.0 - 34.0 pg   MCHC 32.5 30.0 - 36.0 g/dL   RDW 58.3 (H) 09.4 - 07.6 %   Platelets 416 (H) 150 - 400 K/uL   nRBC 0.0 0.0 - 0.2 %  Comprehensive metabolic panel     Status:  Abnormal   Collection Time: 05/13/21  2:53 PM  Result Value Ref Range   Sodium 135 135 - 145 mmol/L   Potassium 3.4 (L) 3.5 - 5.1 mmol/L   Chloride 106 98 - 111 mmol/L   CO2 21 (L) 22 - 32 mmol/L   Glucose, Bld 114 (H) 70 - 99 mg/dL   BUN 8 6 - 20 mg/dL   Creatinine, Ser 05/15/21 0.44 - 1.00 mg/dL   Calcium 8.4 (L) 8.9 - 10.3 mg/dL   Total Protein 6.3 (L) 6.5 - 8.1 g/dL   Albumin 3.5 3.5 - 5.0 g/dL   AST 23 15 - 41 U/L   ALT 21 0 - 44 U/L   Alkaline Phosphatase 49 38 - 126 U/L   Total Bilirubin 0.4 0.3 - 1.2 mg/dL   GFR, Estimated 8.11 >03 mL/min   Anion gap 8 5 - 15     A Missed  AB  P Discharge from MAU in stable condition Cytotec given. Will give limited amount of percocet #10 tabs. Warning signs for worsening condition that would warrant emergency follow-up discussed Patient may return to MAU as needed   Levie Heritage, DO 05/13/2021 3:01 PM

## 2021-05-13 NOTE — MAU Note (Signed)
Erika Thomas is a 41 y.o. at [redacted]w[redacted]d here in MAU reporting: here for follow up hcg. Still having some bleeding. No pain.  Onset of complaint: ongoing  Pain score: 0/10  Vitals:   05/13/21 1452  BP: 117/85  Pulse: 98  Resp: 16  Temp: 98.2 F (36.8 C)  SpO2: 100%     Lab orders placed from triage: released

## 2021-06-03 ENCOUNTER — Encounter: Payer: Self-pay | Admitting: Obstetrics & Gynecology

## 2021-09-18 ENCOUNTER — Ambulatory Visit: Payer: Medicaid Other | Admitting: Physician Assistant

## 2021-11-22 DIAGNOSIS — D509 Iron deficiency anemia, unspecified: Secondary | ICD-10-CM | POA: Diagnosis not present

## 2021-11-22 DIAGNOSIS — R1010 Upper abdominal pain, unspecified: Secondary | ICD-10-CM | POA: Diagnosis not present

## 2021-11-22 DIAGNOSIS — F17210 Nicotine dependence, cigarettes, uncomplicated: Secondary | ICD-10-CM | POA: Diagnosis not present

## 2021-11-22 DIAGNOSIS — K859 Acute pancreatitis without necrosis or infection, unspecified: Secondary | ICD-10-CM | POA: Diagnosis not present

## 2021-11-22 DIAGNOSIS — Z79899 Other long term (current) drug therapy: Secondary | ICD-10-CM | POA: Diagnosis not present

## 2021-11-22 DIAGNOSIS — E876 Hypokalemia: Secondary | ICD-10-CM | POA: Diagnosis not present

## 2021-11-22 DIAGNOSIS — K852 Alcohol induced acute pancreatitis without necrosis or infection: Secondary | ICD-10-CM | POA: Diagnosis not present

## 2021-11-22 DIAGNOSIS — F419 Anxiety disorder, unspecified: Secondary | ICD-10-CM | POA: Diagnosis not present

## 2021-11-22 DIAGNOSIS — R109 Unspecified abdominal pain: Secondary | ICD-10-CM | POA: Diagnosis not present

## 2021-11-22 DIAGNOSIS — R1110 Vomiting, unspecified: Secondary | ICD-10-CM | POA: Diagnosis not present

## 2021-11-25 DIAGNOSIS — F419 Anxiety disorder, unspecified: Secondary | ICD-10-CM | POA: Diagnosis not present

## 2021-11-25 DIAGNOSIS — D509 Iron deficiency anemia, unspecified: Secondary | ICD-10-CM | POA: Diagnosis not present

## 2021-11-25 DIAGNOSIS — F17210 Nicotine dependence, cigarettes, uncomplicated: Secondary | ICD-10-CM | POA: Diagnosis not present

## 2021-11-25 DIAGNOSIS — R1010 Upper abdominal pain, unspecified: Secondary | ICD-10-CM | POA: Diagnosis not present

## 2021-11-25 DIAGNOSIS — E876 Hypokalemia: Secondary | ICD-10-CM | POA: Diagnosis not present

## 2021-11-25 DIAGNOSIS — K859 Acute pancreatitis without necrosis or infection, unspecified: Secondary | ICD-10-CM | POA: Diagnosis not present

## 2022-02-10 ENCOUNTER — Ambulatory Visit: Payer: Medicaid Other | Admitting: Physician Assistant

## 2022-02-10 ENCOUNTER — Encounter: Payer: Self-pay | Admitting: Physician Assistant

## 2022-02-10 VITALS — BP 123/85 | HR 88 | Ht 65.0 in | Wt 187.0 lb

## 2022-02-10 DIAGNOSIS — F17210 Nicotine dependence, cigarettes, uncomplicated: Secondary | ICD-10-CM

## 2022-02-10 DIAGNOSIS — D508 Other iron deficiency anemias: Secondary | ICD-10-CM

## 2022-02-10 DIAGNOSIS — E876 Hypokalemia: Secondary | ICD-10-CM

## 2022-02-10 DIAGNOSIS — Z13220 Encounter for screening for lipoid disorders: Secondary | ICD-10-CM

## 2022-02-10 DIAGNOSIS — H1013 Acute atopic conjunctivitis, bilateral: Secondary | ICD-10-CM | POA: Diagnosis not present

## 2022-02-10 MED ORDER — OLOPATADINE HCL 0.1 % OP SOLN: 1.0000 [drp] | Freq: Two times a day (BID) | OPHTHALMIC | 0 refills | Status: AC

## 2022-02-10 MED ORDER — CETIRIZINE HCL 10 MG PO TABS: 10.0000 mg | ORAL_TABLET | Freq: Every day | ORAL | 11 refills | Status: AC

## 2022-02-10 NOTE — Progress Notes (Signed)
   New Patient Office Visit  Subjective    Patient ID: Erika Thomas, female    DOB: 06-Oct-1980  Age: 41 y.o. MRN: [id number]  CC: No chief complaint on file.   HPI Erika Thomas presents to establish care *** Started last Tuesday - red itchy burning - watery - little matted in the morning Works in nursing home  No other sxs  Visine - no help  No daily allergy medication   Hospitalized for pancreatitis  10/2021 Needs PCP   Does not take iron   Uses inhaler only as needed - does not take dulera dauly - not much need lately   Use to have panic attacks - not so much anymore      Outpatient Encounter Medications as of 02/10/2022  Medication Sig  . Acetaminophen (TYLENOL EXTRA STRENGTH PO) Take 1,300 mg by mouth every 8 (eight) hours as needed (headache).  Marland Kitchen albuterol (PROVENTIL) (2.5 MG/3ML) 0.083% nebulizer solution Take 3 mLs (2.5 mg total) by nebulization every 2 (two) hours as needed for wheezing or shortness of breath.  . benzonatate (TESSALON) 200 MG capsule Take 1 capsule (200 mg total) by mouth 3 (three) times daily as needed for cough.  . citalopram (CELEXA) 40 MG tablet Take 1 tablet (40 mg total) by mouth daily.  . ferrous sulfate 325 (65 FE) MG tablet Take 1 tablet (325 mg total) by mouth 2 (two) times daily with a meal.  . mometasone-formoterol (DULERA) 100-5 MCG/ACT AERO Inhale 2 puffs into the lungs 2 (two) times daily.  Marland Kitchen oxyCODONE-acetaminophen (PERCOCET/ROXICET) 5-325 MG tablet Take 1-2 tablets by mouth every 6 (six) hours as needed.   No facility-administered encounter medications on file as of 02/10/2022.    Past Medical History:  Diagnosis Date  . Asthma     Past Surgical History:  Procedure Laterality Date  . CESAREAN SECTION      Family History  Problem Relation Age of Onset  . Hypertension Other     Social History   Socioeconomic History  . Marital status: Married    Spouse name: Not on file  . Number of children: Not on file  .  Years of education: Not on file  . Highest education level: Not on file  Occupational History  . Not on file  Tobacco Use  . Smoking status: Every Day    Packs/day: 1.00    Types: Cigarettes  . Smokeless tobacco: Never  Vaping Use  . Vaping Use: Never used  Substance and Sexual Activity  . Alcohol use: Not Currently  . Drug use: Yes    Types: Cocaine    Comment: last used cocaine in the last seven days as of 05/09/2021  . Sexual activity: Not Currently  Other Topics Concern  . Not on file  Social History Narrative  . Not on file   Social Determinants of Health   Financial Resource Strain: Not on file  Food Insecurity: Not on file  Transportation Needs: Not on file  Physical Activity: Not on file  Stress: Not on file  Social Connections: Not on file  Intimate Partner Violence: Not on file    ROS      Objective    LMP 03/26/2021 (Approximate)   Physical Exam  {Labs (Optional):23779}    Assessment & Plan:   Problem List Items Addressed This Visit   None   No follow-ups on file.   Erika Knudsen Mayers, PA-C

## 2022-02-10 NOTE — Patient Instructions (Addendum)
You are going to start taking Zyrtec on a daily basis.  You will also use Pataday eyedrops twice daily in both eyes until your symptoms resolved.  We will call you with your lab results when they are available.  I hope that you feel better soon, please let us know if there is anything else we can do for you  Roney Jaffe, PA-C Physician Assistant [facility] [email or web address]   Allergic Conjunctivitis, Adult  Allergic conjunctivitis is inflammation of the clear membrane that covers the white part of your eye and the inner surface of your eyelid. This area is called the conjunctiva. This condition can make your eye red or pink. It can also make your eye feel itchy. This condition is not contagious. This means that it cannot be spread from one person to another person. What are the causes? This condition is caused by allergens. These are things that can cause an allergic reaction in some people. Common allergens include: Outdoor allergens, such as: Pollen, including pollen from grass and weeds. Mold. Car fumes. Indoor allergens, such as: Dust. Smoke. Mold. Proteins in a pet's pee (urine), saliva, or dander. Proteins that build up in contact lenses. What increases the risk? You are more likely to develop this condition if you have a family history of these things: Allergies. Conditions that you get because of allergens, such as asthma or inflammation of the skin (eczema). What are the signs or symptoms? Symptoms of this condition include eyes that are: Itchy. Red. Watery. Puffy. Your eyes may also: Sting or burn. Have clear fluid draining from them. Have thick mucus coming from them. This happens in severe cases. How is this treated? Treatment for this condition may include: Using cold, wet cloths (cold compresses) to soothe itching and swelling. Washing the face, hair, and clothing to remove allergens. Using eye  drops. These may include: Eye drops that block allergies. Eye drops that reduce swelling and irritation. Steroid eye drops if other treatments have not worked. Oral antihistamine medicines. These medicines lessen your allergies. You may need these if eye drops do not help or are difficult to use. Air purifier at home and work. Wrap around sunglasses. This may help to block allergens from reaching the eye. Not wearing contact lenses, if the doctor has found that contact lenses caused your symptoms. Use daily wear disposal contact lenses instead. Follow these instructions at home: Eye care Place a cool, clean washcloth on your eye for 10-20 minutes. Do this 3-4 times a day. Do not touch or rub your eyes. Do not wear contact lenses until the inflammation is gone. Wear glasses instead. Do not wear eye makeup until the inflammation is gone. General instructions Try not to be around things that you are allergic to. Take or apply over-the-counter and prescription medicines only as told by your doctor. These include any eye drops. Drink enough fluid to keep your pee pale yellow. Keep all follow-up visits. Contact a doctor if: Your symptoms get worse. Your symptoms do not get better with treatment. You have mild eye pain. You are sensitive to light. You have spots or blisters on your eyes. You have pus coming from your eye. You have a fever. Get help right away if: You have redness, swelling, or other symptoms in only one eye. You cannot see well. You have other vision changes. You have very bad eye pain. Summary Allergic conjunctivitis is caused by allergens. It can make your eye red or pink, and it can  make your eye feel itchy. This condition cannot be spread from one person to another person. Avoid things that you are allergic to. Take or apply over-the-counter and prescription medicines only as told by your doctor. These include any eye drops. Contact your doctor if your symptoms get  worse or they do not get better with treatment. This information is not intended to replace advice given to you by your health care provider. Make sure you discuss any questions you have with your health care provider. Document Revised: 05/24/2021 Document Reviewed: 05/24/2021 Elsevier Patient Education  2023 [employer].

## 2022-02-11 ENCOUNTER — Ambulatory Visit: Payer: Medicaid Other | Attending: Family

## 2022-02-11 ENCOUNTER — Encounter: Payer: Self-pay | Admitting: Physician Assistant

## 2022-02-11 ENCOUNTER — Telehealth: Payer: Medicaid Other | Admitting: Physician Assistant

## 2022-02-11 DIAGNOSIS — H1033 Unspecified acute conjunctivitis, bilateral: Secondary | ICD-10-CM | POA: Diagnosis not present

## 2022-02-11 MED ORDER — POLYMYXIN B-TRIMETHOPRIM 10000-0.1 UNIT/ML-% OP SOLN: OPHTHALMIC | 0 refills | Status: AC

## 2022-02-11 NOTE — Progress Notes (Signed)
Erroneous encounter-disregard

## 2022-02-11 NOTE — Progress Notes (Signed)
Virtual Visit Consent   Erika Thomas, you are scheduled for a virtual visit with a [facility] provider today. Just as with appointments in the office, your consent must be obtained to participate. Your consent will be active for this visit and any virtual visit you may have with one of our providers in the next 365 days. If you have a MyChart account, a copy of this consent can be sent to you electronically.  As this is a virtual visit, video technology does not allow for your provider to perform a traditional examination. This may limit your provider's ability to fully assess your condition. If your provider identifies any concerns that need to be evaluated in person or the need to arrange testing (such as labs, EKG, etc.), we will make arrangements to do so. Although advances in technology are sophisticated, we cannot ensure that it will always work on either your end or our end. If the connection with a video visit is poor, the visit may have to be switched to a telephone visit. With either a video or telephone visit, we are not always able to ensure that we have a secure connection.  By engaging in this virtual visit, you consent to the provision of healthcare and authorize for your insurance to be billed (if applicable) for the services provided during this visit. Depending on your insurance coverage, you may receive a charge related to this service.  I need to obtain your verbal consent now. Are you willing to proceed with your visit today? Erika Thomas has provided verbal consent on 02/11/2022 for a virtual visit (video or telephone). Erika Thomas, [place]  Date: 02/11/2022 7:47 PM  Virtual Visit via Video Note   I, Erika Thomas, connected with  Judia Arnott  ([id number], 28-Mar-1981) on 02/11/22 at  7:45 PM EST by a video-enabled telemedicine application and verified that I am speaking with the correct person using two identifiers.  Location: Patient: Virtual Visit Location  Patient: Home Provider: Virtual Visit Location Provider: Home Office   I discussed the limitations of evaluation and management by telemedicine and the availability of in person appointments. The patient expressed understanding and agreed to proceed.    History of Present Illness: Erika Thomas is a 41 y.o. who identifies as a female who was assigned female at birth, and is being seen today for bilateral conjunctivitis.  Patient endorses symptoms starting on her right about a week ago with redness, irritation and drainage.  Moved to her other eye within the past few days.  Was seen at the mobile clinic yesterday and diagnosed with bilateral allergic conjunctivitis.  Was started on Zyrtec and olopatadine eyedrops.  Endorses taking both as directed but not noting any substantial improvement.  Feels symptoms getting worse with increase drainage and irritation.  Denies fever, chills, malaise or fatigue.  Denies any runny nose or sneezing.  Denies history of seasonal or environmental allergies.  Denies any trauma or injury to the eye.  Denies vision changes.  Denies recent travel or sick contact.  HPI: HPI  Problems:  Patient Active Problem List   Diagnosis Date Noted   Hypokalemia 01/24/2021   Smoking 01/23/2021   Iron deficiency anemia, unspecified 01/23/2021   Acute metabolic encephalopathy 01/23/2021   Acute respiratory failure with hypoxia and hypercapnia (HCC) 01/21/2021   COPD with acute exacerbation (HCC) 01/20/2021   Influenza A 01/20/2021   Hypocalcemia 01/20/2021    Allergies: No Known Allergies Medications:  Current Outpatient Medications:    trimethoprim-polymyxin  b (POLYTRIM) ophthalmic solution, Apply 1-2 drops into affected eye QID x 5 days., Disp: 10 mL, Rfl: 0   Acetaminophen (TYLENOL EXTRA STRENGTH PO), Take 1,300 mg by mouth every 8 (eight) hours as needed (headache)., Disp: , Rfl:    albuterol (PROVENTIL) (2.5 MG/3ML) 0.083% nebulizer solution, Take 3 mLs (2.5 mg total) by  nebulization every 2 (two) hours as needed for wheezing or shortness of breath., Disp: 75 mL, Rfl: 12   cetirizine (ZYRTEC) 10 MG tablet, Take 1 tablet (10 mg total) by mouth daily., Disp: 30 tablet, Rfl: 11   citalopram (CELEXA) 40 MG tablet, Take 1 tablet (40 mg total) by mouth daily., Disp: 30 tablet, Rfl: 6   ferrous sulfate 325 (65 FE) MG tablet, Take 1 tablet (325 mg total) by mouth 2 (two) times daily with a meal., Disp: 60 tablet, Rfl: 3   hydrOXYzine (ATARAX) 25 MG tablet, Take 25 mg by mouth every 6 (six) hours as needed for anxiety., Disp: , Rfl:    mometasone-formoterol (DULERA) 100-5 MCG/ACT AERO, Inhale 2 puffs into the lungs 2 (two) times daily., Disp: 1 each, Rfl:    olopatadine (PATADAY) 0.1 % ophthalmic solution, Place 1 drop into both eyes 2 (two) times daily., Disp: 5 mL, Rfl: 0  Observations/Objective: Patient is well-developed, well-nourished in no acute distress.  Resting comfortably at home.  Head is normocephalic, atraumatic.  No labored breathing. Speech is clear and coherent with logical content.  Patient is alert and oriented at baseline.  Bilateral conjunctival volar injection noted with purulent drainage.  Some crusting of eyelashes noted.  No lid swelling appreciated bilaterally.  Pupils are equal and round.  EOMI.  Assessment and Plan: 1. Acute conjunctivitis of both eyes, unspecified acute conjunctivitis type - trimethoprim-polymyxin b (POLYTRIM) ophthalmic solution; Apply 1-2 drops into affected eye QID x 5 days.  Dispense: 10 mL; Refill: 0  Concerned that if this was possibly allergy related initially, that due to frequent rubbing of the eyes, she has developed a secondary bacterial conjunctivitis.  Conversely this truly could just be a primary bacterial conjunctivitis.  Supportive measures and OTC medications reviewed.  Okay to continue the olopatadine drops, but we will add on Polytrim OP to apply as directed.  Strict follow-up precautions reviewed.  Follow  Up Instructions: I discussed the assessment and treatment plan with the patient. The patient was provided an opportunity to ask questions and all were answered. The patient agreed with the plan and demonstrated an understanding of the instructions.  A copy of instructions were sent to the patient via MyChart unless otherwise noted below.    The patient was advised to call back or seek an in-person evaluation if the symptoms worsen or if the condition fails to improve as anticipated.  Time:  I spent 10 minutes with the patient via telehealth technology discussing the above problems/concerns.    Erika Climes, PA-C

## 2022-02-11 NOTE — Patient Instructions (Signed)
  Erika Thomas, thank you for joining [name], PA-C for today's virtual visit.  While this provider is not your primary care provider (PCP), if your PCP is located in our provider database this encounter information will be shared with them immediately following your visit.   A [facility] MyChart account gives you access to today's visit and all your visits, tests, and labs performed at [facility] " click here if you don't have a [facility] MyChart account or go to [email or web address]  Consent: (Patient) Erika Thomas provided verbal consent for this virtual visit at the beginning of the encounter.  Current Medications:  Current Outpatient Medications:    Acetaminophen (TYLENOL EXTRA STRENGTH PO), Take 1,300 mg by mouth every 8 (eight) hours as needed (headache)., Disp: , Rfl:    albuterol (PROVENTIL) (2.5 MG/3ML) 0.083% nebulizer solution, Take 3 mLs (2.5 mg total) by nebulization every 2 (two) hours as needed for wheezing or shortness of breath., Disp: 75 mL, Rfl: 12   cetirizine (ZYRTEC) 10 MG tablet, Take 1 tablet (10 mg total) by mouth daily., Disp: 30 tablet, Rfl: 11   citalopram (CELEXA) 40 MG tablet, Take 1 tablet (40 mg total) by mouth daily., Disp: 30 tablet, Rfl: 6   ferrous sulfate 325 (65 FE) MG tablet, Take 1 tablet (325 mg total) by mouth 2 (two) times daily with a meal., Disp: 60 tablet, Rfl: 3   hydrOXYzine (ATARAX) 25 MG tablet, Take 25 mg by mouth every 6 (six) hours as needed for anxiety., Disp: , Rfl:    mometasone-formoterol (DULERA) 100-5 MCG/ACT AERO, Inhale 2 puffs into the lungs 2 (two) times daily., Disp: 1 each, Rfl:    olopatadine (PATADAY) 0.1 % ophthalmic solution, Place 1 drop into both eyes 2 (two) times daily., Disp: 5 mL, Rfl: 0   Medications ordered in this encounter:  No orders of the defined types were placed in this encounter.    *If you need refills on other medications prior to your next appointment, please contact  your pharmacy*  Follow-Up: Call back or seek an in-person evaluation if the symptoms worsen or if the condition fails to improve as anticipated.  [facility] [phone]  Other Instructions Please keep hands washed and avoid rubbing/scratching around the eyes. Apply warm compresses for 10 to 15 minutes a few times a day to promote drainage. Okay to continue the olopatadine eyedrops. Start the Polytrim eyedrops (antibiotic) as directed.  If you are not noting a substantial improvement over the next 48 to 72 hours, or if you note any new or worsening symptoms despite treatment, you need to seek an in person evaluation ASAP.  Please do not delay care.   If you have been instructed to have an in-person evaluation today at a local Urgent Care facility, please use the link below. It will take you to a list of all of our available [facility], including address, phone number and hours of operation. Please do not delay care.  [facility]  If you or a family member do not have a primary care provider, use the link below to schedule a visit and establish care. When you choose a [facility] primary care physician or advanced practice provider, you gain a long-term partner in health. Find a Primary Care Provider  Learn more about [facility]'s in-office and virtual care options: [facility] - Get Care Now

## 2022-02-17 ENCOUNTER — Encounter: Payer: Medicaid Other | Admitting: Family

## 2022-02-17 DIAGNOSIS — Z7689 Persons encountering health services in other specified circumstances: Secondary | ICD-10-CM

## 2022-09-11 ENCOUNTER — Telehealth: Payer: Managed Care, Other (non HMO) | Admitting: Family Medicine

## 2022-09-11 DIAGNOSIS — F419 Anxiety disorder, unspecified: Secondary | ICD-10-CM | POA: Diagnosis not present

## 2022-09-11 MED ORDER — CITALOPRAM HYDROBROMIDE 20 MG PO TABS: 20.0000 mg | ORAL_TABLET | Freq: Every day | ORAL | 0 refills | Status: DC

## 2022-09-11 NOTE — Patient Instructions (Signed)
Erika Thomas, thank you for joining Freddy Finner, NP for today's virtual visit.  While this provider is not your primary care provider (PCP), if your PCP is located in our provider database this encounter information will be shared with them immediately following your visit.   A [facility] MyChart account gives you access to today's visit and all your visits, tests, and labs performed at [facility] " click here if you don't have a [facility] MyChart account or go to [email or web address]  Consent: (Patient) Erika Thomas provided verbal consent for this virtual visit at the beginning of the encounter.  Current Medications:  Current Outpatient Medications:    citalopram (CELEXA) 20 MG tablet, Take 1 tablet (20 mg total) by mouth daily., Disp: 30 tablet, Rfl: 0   Acetaminophen (TYLENOL EXTRA STRENGTH PO), Take 1,300 mg by mouth every 8 (eight) hours as needed (headache)., Disp: , Rfl:    albuterol (PROVENTIL) (2.5 MG/3ML) 0.083% nebulizer solution, Take 3 mLs (2.5 mg total) by nebulization every 2 (two) hours as needed for wheezing or shortness of breath., Disp: 75 mL, Rfl: 12   cetirizine (ZYRTEC) 10 MG tablet, Take 1 tablet (10 mg total) by mouth daily., Disp: 30 tablet, Rfl: 11   ferrous sulfate 325 (65 FE) MG tablet, Take 1 tablet (325 mg total) by mouth 2 (two) times daily with a meal., Disp: 60 tablet, Rfl: 3   hydrOXYzine (ATARAX) 25 MG tablet, Take 25 mg by mouth every 6 (six) hours as needed for anxiety., Disp: , Rfl:    mometasone-formoterol (DULERA) 100-5 MCG/ACT AERO, Inhale 2 puffs into the lungs 2 (two) times daily., Disp: 1 each, Rfl:    olopatadine (PATADAY) 0.1 % ophthalmic solution, Place 1 drop into both eyes 2 (two) times daily., Disp: 5 mL, Rfl: 0   trimethoprim-polymyxin b (POLYTRIM) ophthalmic solution, Apply 1-2 drops into affected eye QID x 5 days., Disp: 10 mL, Rfl: 0   Medications ordered in this encounter:  Meds ordered this encounter   Medications   citalopram (CELEXA) 20 MG tablet    Sig: Take 1 tablet (20 mg total) by mouth daily.    Dispense:  30 tablet    Refill:  0    Order Specific Question:   Supervising Provider    Answer:   Merrilee Jansky [id number]     *If you need refills on other medications prior to your next appointment, please contact your pharmacy*  Follow-Up: Call back or seek an in-person evaluation if the symptoms worsen or if the condition fails to improve as anticipated.  [facility] [phone]  Other Instructions   If you do not have a PCP, [facility] offers a free physician referral service available at [phone]. Our trained staff has the experience, knowledge and resources to put you in touch with a physician who is right for you.    If you have been instructed to have an in-person evaluation today at a local Urgent Care facility, please use the link below. It will take you to a list of all of our available [facility], including address, phone number and hours of operation. Please do not delay care.  [facility]  If you or a family member do not have a primary care provider, use the link below to schedule a visit and establish care. When you choose a [facility] primary care physician or advanced practice provider, you gain a long-term partner in health. Find a Primary  Care Provider  Learn more about [facility]'s in-office and virtual care options: [facility]

## 2022-09-11 NOTE — Progress Notes (Signed)
Virtual Visit Consent   [name], you are scheduled for a virtual visit with a [facility] provider today. Just as with appointments in the office, your consent must be obtained to participate. Your consent will be active for this visit and any virtual visit you may have with one of our providers in the next 365 days. If you have a MyChart account, a copy of this consent can be sent to you electronically.  As this is a virtual visit, video technology does not allow for your provider to perform a traditional examination. This may limit your provider's ability to fully assess your condition. If your provider identifies any concerns that need to be evaluated in person or the need to arrange testing (such as labs, EKG, etc.), we will make arrangements to do so. Although advances in technology are sophisticated, we cannot ensure that it will always work on either your end or our end. If the connection with a video visit is poor, the visit may have to be switched to a telephone visit. With either a video or telephone visit, we are not always able to ensure that we have a secure connection.  By engaging in this virtual visit, you consent to the provision of healthcare and authorize for your insurance to be billed (if applicable) for the services provided during this visit. Depending on your insurance coverage, you may receive a charge related to this service.  I need to obtain your verbal consent now. Are you willing to proceed with your visit today? [name] has provided verbal consent on 09/11/2022 for a virtual visit (video or telephone). Freddy Finner, NP  Date: 09/11/2022 11:37 AM  Virtual Visit via Video Note   I, Freddy Finner, connected with  Erika Thomas  ([id number], 1981/03/25) on 09/11/22 at 11:30 AM EDT by a video-enabled telemedicine application and verified that I am speaking with the correct person using two identifiers.  Location: Patient: Virtual Visit Location Patient:  Home Provider: Virtual Visit Location Provider: Home Office   I discussed the limitations of evaluation and management by telemedicine and the availability of in person appointments. The patient expressed understanding and agreed to proceed.    History of Present Illness: Erika Thomas is a 42 y.o. who identifies as a female who was assigned female at birth, and is being seen today for refill of anxiety medication  Onset was off and on for years Associated symptoms are panic feelings, hard time driving around Modifying factors are celexa - has not been taking it like she should Denies chest pain, shortness of breath, fevers, chills, panic, SI or HI  Does not have a PCP, was seen by [facility] Tried to call the number to them but no ability to reach anyone.   Problems:  Patient Active Problem List   Diagnosis Date Noted   Hypokalemia 01/24/2021   Smoking 01/23/2021   Iron deficiency anemia, unspecified 01/23/2021   Acute metabolic encephalopathy 01/23/2021   Acute respiratory failure with hypoxia and hypercapnia (HCC) 01/21/2021   COPD with acute exacerbation (HCC) 01/20/2021   Influenza A 01/20/2021   Hypocalcemia 01/20/2021    Allergies: No Known Allergies Medications:  Current Outpatient Medications:    Acetaminophen (TYLENOL EXTRA STRENGTH PO), Take 1,300 mg by mouth every 8 (eight) hours as needed (headache)., Disp: , Rfl:    albuterol (PROVENTIL) (2.5 MG/3ML) 0.083% nebulizer solution, Take 3 mLs (2.5 mg total) by nebulization every 2 (two) hours as needed for wheezing or shortness  of breath., Disp: 75 mL, Rfl: 12   cetirizine (ZYRTEC) 10 MG tablet, Take 1 tablet (10 mg total) by mouth daily., Disp: 30 tablet, Rfl: 11   citalopram (CELEXA) 40 MG tablet, Take 1 tablet (40 mg total) by mouth daily., Disp: 30 tablet, Rfl: 6   ferrous sulfate 325 (65 FE) MG tablet, Take 1 tablet (325 mg total) by mouth 2 (two) times daily with a meal., Disp: 60 tablet, Rfl: 3    hydrOXYzine (ATARAX) 25 MG tablet, Take 25 mg by mouth every 6 (six) hours as needed for anxiety., Disp: , Rfl:    mometasone-formoterol (DULERA) 100-5 MCG/ACT AERO, Inhale 2 puffs into the lungs 2 (two) times daily., Disp: 1 each, Rfl:    olopatadine (PATADAY) 0.1 % ophthalmic solution, Place 1 drop into both eyes 2 (two) times daily., Disp: 5 mL, Rfl: 0   trimethoprim-polymyxin b (POLYTRIM) ophthalmic solution, Apply 1-2 drops into affected eye QID x 5 days., Disp: 10 mL, Rfl: 0  Observations/Objective: Patient is well-developed, well-nourished in no acute distress.  Resting comfortably  at home.  Head is normocephalic, atraumatic.  No labored breathing.  Speech is clear and coherent with logical content.  Patient is alert and oriented at baseline.    Assessment and Plan:   1. Anxiety  - citalopram (CELEXA) 20 MG tablet; Take 1 tablet (20 mg total) by mouth daily.  Dispense: 30 tablet; Refill: 0  -denies SI and HI -needs PCP to have on going orders for this -provided with referral PCP number on AVS and discussed this was well -given she has not been taking it consistently- I will reduce the dose and only give a month to ensure she gets follow up, knows we will not offer additional refills  Reviewed side effects, risks and benefits of medication.    Patient acknowledged agreement and understanding of the plan.   Past Medical, Surgical, Social History, Allergies, and Medications have been Reviewed.    Follow Up Instructions: I discussed the assessment and treatment plan with the patient. The patient was provided an opportunity to ask questions and all were answered. The patient agreed with the plan and demonstrated an understanding of the instructions.  A copy of instructions were sent to the patient via MyChart unless otherwise noted below.     The patient was advised to call back or seek an in-person evaluation if the symptoms worsen or if the condition fails to improve as  anticipated.  Time:  I spent 10 minutes with the patient via telehealth technology discussing the above problems/concerns.    Freddy Finner, NP

## 2022-12-12 ENCOUNTER — Other Ambulatory Visit: Payer: Self-pay | Admitting: Family Medicine

## 2022-12-12 DIAGNOSIS — F419 Anxiety disorder, unspecified: Secondary | ICD-10-CM

## 2022-12-15 ENCOUNTER — Other Ambulatory Visit: Payer: Self-pay | Admitting: Family Medicine

## 2022-12-15 DIAGNOSIS — F419 Anxiety disorder, unspecified: Secondary | ICD-10-CM

## 2022-12-16 ENCOUNTER — Encounter: Payer: Self-pay | Admitting: Family

## 2022-12-17 ENCOUNTER — Telehealth: Payer: Managed Care, Other (non HMO) | Admitting: Physician Assistant

## 2022-12-17 DIAGNOSIS — Z760 Encounter for issue of repeat prescription: Secondary | ICD-10-CM

## 2022-12-17 NOTE — Progress Notes (Signed)
Because we cannot provide refills via e-visit and you have already been given a refill of this medication via video visit before and we cannot continue to refill, I feel your condition warrants further evaluation and I recommend that you be seen in a face to face visit.   NOTE: There will be NO CHARGE for this eVisit   If you are having a true medical emergency please call 911.      For an urgent face to face visit, [facility] has eight urgent care centers for your convenience:   NEW!! [facility] Health Urgent Care Center at [facility] Get Driving Directions [phone] [address], [address] [place], [address]    [facility] Health Urgent Care Center at [facility] Get Driving Directions [phone] [address] [place], [place] [address]   [facility] Health Urgent Care Center [facility]) Get Driving Directions [phone] [address] [place], [place] [address]  [facility] Health Urgent Care Center [facility]) Get Driving Directions [phone] [address] [place],  [place]  [address]  [facility] Health Urgent Care Center [facility] - at [employer]  [phone] [address] [place],  [place] [address]   [facility] Health Urgent Care at [facility] Get Driving Directions [phone] [address], [address] [place], [place] [address]   [facility] Health Urgent Care at [facility] Get Driving Directions  [phone] [address] [place], [place] [address]   [facility] Health Urgent Care at [facility] Directions [phone] [address] [place], [place] [address]  Your MyChart E-visit questionnaire answers were reviewed by a board certified advanced clinical practitioner to complete your personal care plan based on your specific symptoms.  Thank you for using e-Visits.

## 2022-12-19 ENCOUNTER — Telehealth: Payer: Managed Care, Other (non HMO) | Admitting: Physician Assistant

## 2022-12-19 DIAGNOSIS — F419 Anxiety disorder, unspecified: Secondary | ICD-10-CM

## 2022-12-19 MED ORDER — CITALOPRAM HYDROBROMIDE 40 MG PO TABS: 40.0000 mg | ORAL_TABLET | Freq: Every day | ORAL | 0 refills | Status: AC

## 2022-12-19 NOTE — Patient Instructions (Signed)
Erika Thomas, thank you for joining Margaretann Loveless, PA-C for today's virtual visit.  While this provider is not your primary care provider (PCP), if your PCP is located in our provider database this encounter information will be shared with them immediately following your visit.   A [facility] MyChart account gives you access to today's visit and all your visits, tests, and labs performed at [facility] " click here if you don't have a [facility] MyChart account or go to [email or web address]  Consent: (Patient) Erika Thomas provided verbal consent for this virtual visit at the beginning of the encounter.  Current Medications:  Current Outpatient Medications:    citalopram (CELEXA) 40 MG tablet, Take 1 tablet (40 mg total) by mouth daily., Disp: 60 tablet, Rfl: 0   Acetaminophen (TYLENOL EXTRA STRENGTH PO), Take 1,300 mg by mouth every 8 (eight) hours as needed (headache)., Disp: , Rfl:    albuterol (PROVENTIL) (2.5 MG/3ML) 0.083% nebulizer solution, Take 3 mLs (2.5 mg total) by nebulization every 2 (two) hours as needed for wheezing or shortness of breath., Disp: 75 mL, Rfl: 12   cetirizine (ZYRTEC) 10 MG tablet, Take 1 tablet (10 mg total) by mouth daily., Disp: 30 tablet, Rfl: 11   ferrous sulfate 325 (65 FE) MG tablet, Take 1 tablet (325 mg total) by mouth 2 (two) times daily with a meal., Disp: 60 tablet, Rfl: 3   hydrOXYzine (ATARAX) 25 MG tablet, Take 25 mg by mouth every 6 (six) hours as needed for anxiety., Disp: , Rfl:    mometasone-formoterol (DULERA) 100-5 MCG/ACT AERO, Inhale 2 puffs into the lungs 2 (two) times daily., Disp: 1 each, Rfl:    olopatadine (PATADAY) 0.1 % ophthalmic solution, Place 1 drop into both eyes 2 (two) times daily., Disp: 5 mL, Rfl: 0   trimethoprim-polymyxin b (POLYTRIM) ophthalmic solution, Apply 1-2 drops into affected eye QID x 5 days., Disp: 10 mL, Rfl: 0   Medications ordered in this encounter:  Meds ordered this encounter   Medications   citalopram (CELEXA) 40 MG tablet    Sig: Take 1 tablet (40 mg total) by mouth daily.    Dispense:  60 tablet    Refill:  0    Order Specific Question:   Supervising Provider    Answer:   Merrilee Jansky [id number]     *If you need refills on other medications prior to your next appointment, please contact your pharmacy*  Follow-Up: Call back or seek an in-person evaluation if the symptoms worsen or if the condition fails to improve as anticipated.  [facility] [phone]  Other Instructions Managing Anxiety, Adult After being diagnosed with anxiety, you may be relieved to know why you have felt or behaved a certain way. You may also feel overwhelmed about the treatment ahead and what it will mean for your life. With care and support, you can manage your anxiety. How to manage lifestyle changes Understanding the difference between stress and anxiety Although stress can play a role in anxiety, it is not the same as anxiety. Stress is your body's reaction to life changes and events, both good and bad. Stress is often caused by something external, such as a deadline, test, or competition. It normally goes away after the event has ended and will last just a few hours. But, stress can be ongoing and can lead to more than just stress. Anxiety is caused by something internal, such as imagining a terrible outcome or worrying that something  will go wrong that will greatly upset you. Anxiety often does not go away even after the event is over, and it can become a long-term (chronic) worry. Lowering stress and anxiety Talk with your health care provider or a counselor to learn more about lowering anxiety and stress. They may suggest tension-reduction techniques, such as: Music. Spend time creating or listening to music that you enjoy and that inspires you. Mindfulness-based meditation. Practice being aware of your normal breaths while not trying to control your  breathing. It can be done while sitting or walking. Centering prayer. Focus on a word, phrase, or sacred image that means something to you and brings you peace. Deep breathing. Expand your stomach and inhale slowly through your nose. Hold your breath for 3-5 seconds. Then breathe out slowly, letting your stomach muscles relax. Self-talk. Learn to notice and spot thought patterns that lead to anxiety reactions. Change those patterns to thoughts that feel peaceful. Muscle relaxation. Take time to tense muscles and then relax them. Choose a tension-reduction technique that fits your lifestyle and personality. These techniques take time and practice. Set aside 5-15 minutes a day to do them. Specialized therapists can offer counseling and training in these techniques. The training to help with anxiety may be covered by some insurance plans. Other things you can do to manage stress and anxiety include: Keeping a stress diary. This can help you learn what triggers your reaction and then learn ways to manage your response. Thinking about how you react to certain situations. You may not be able to control everything, but you can control your response. Making time for activities that help you relax and not feeling guilty about spending your time in this way. Doing visual imagery. This involves imagining or creating mental pictures to help you relax. Practicing yoga. Through yoga poses, you can lower tension and relax.  Medicines Medicines for anxiety include: Antidepressant medicines. These are usually prescribed for long-term daily control. Anti-anxiety medicines. These may be added in severe cases, especially when panic attacks occur. When used together, medicines, psychotherapy, and tension-reduction techniques may be the most effective treatment. Relationships Relationships can play a big part in helping you recover. Spend more time connecting with trusted friends and family members. Think about going  to couples counseling if you have a partner, taking family education classes, or going to family therapy. Therapy can help you and others better understand your anxiety. How to recognize changes in your anxiety Everyone responds differently to treatment for anxiety. Recovery from anxiety happens when symptoms lessen and stop interfering with your daily life at home or work. This may mean that you will start to: Have better concentration and focus. Worry will interfere less in your daily thinking. Sleep better. Be less irritable. Have more energy. Have improved memory. Try to recognize when your condition is getting worse. Contact your provider if your symptoms interfere with home or work and you feel like your condition is not improving. Follow these instructions at home: Activity Exercise. Adults should: Exercise for at least 150 minutes each week. The exercise should increase your heart rate and make you sweat (moderate-intensity exercise). Do strengthening exercises at least twice a week. Get the right amount and quality of sleep. Most adults need 7-9 hours of sleep each night. Lifestyle  Eat a healthy diet that includes plenty of vegetables, fruits, whole grains, low-fat dairy products, and lean protein. Do not eat a lot of foods that are high in fats, added sugars, or salt (sodium).  Make choices that simplify your life. Do not use any products that contain nicotine or tobacco. These products include cigarettes, chewing tobacco, and vaping devices, such as e-cigarettes. If you need help quitting, ask your provider. Avoid caffeine, alcohol, and certain over-the-counter cold medicines. These may make you feel worse. Ask your pharmacist which medicines to avoid. General instructions Take over-the-counter and prescription medicines only as told by your provider. Keep all follow-up visits. This is to make sure you are managing your anxiety well or if you need more support. Where to find  support You can get help and support from: Self-help groups. Online and [employer]. A trusted spiritual leader. Couples counseling. Family education classes. Family therapy. Where to find more information You may find that joining a support group helps you deal with your anxiety. The following sources can help you find counselors or support groups near you: Mental Health America: mentalhealthamerica.net Anxiety and Depression Association of [place] (ADAA): adaa.org The [employer] American on Mental Illness (NAMI): nami.org Contact a health care provider if: You have a hard time staying focused or finishing tasks. You spend many hours a day feeling worried about everyday life. You are very tired because you cannot stop worrying. You start to have headaches or often feel tense. You have chronic nausea or diarrhea. Get help right away if: Your heart feels like it is racing. You have shortness of breath. You have thoughts of hurting yourself or others. Get help right away if you feel like you may hurt yourself or others, or have thoughts about taking your own life. Go to your nearest emergency room or: Call 911. Call the National Suicide Prevention Lifeline at 613-339-0524 or 988. This is open 24 hours a day. Text the Crisis Text Line at 718 177 9469. This information is not intended to replace advice given to you by your health care provider. Make sure you discuss any questions you have with your health care provider. Document Revised: 12/24/2021 Document Reviewed: 07/08/2020 Elsevier Patient Education  2024 Elsevier Inc.    If you have been instructed to have an in-person evaluation today at a local Urgent Care facility, please use the link below. It will take you to a list of all of our available [facility], including address, phone number and hours of operation. Please do not delay care.  [facility]  If you or a family member do not have a primary  care provider, use the link below to schedule a visit and establish care. When you choose a [facility] primary care physician or advanced practice provider, you gain a long-term partner in health. Find a Primary Care Provider  Learn more about [facility] in-office and virtual care options: [facility] - Get Care Now

## 2022-12-19 NOTE — Progress Notes (Signed)
Virtual Visit Consent   Erika Thomas, you are scheduled for a virtual visit with a [facility] provider today. Just as with appointments in the office, your consent must be obtained to participate. Your consent will be active for this visit and any virtual visit you may have with one of our providers in the next 365 days. If you have a MyChart account, a copy of this consent can be sent to you electronically.  As this is a virtual visit, video technology does not allow for your provider to perform a traditional examination. This may limit your provider's ability to fully assess your condition. If your provider identifies any concerns that need to be evaluated in person or the need to arrange testing (such as labs, EKG, etc.), we will make arrangements to do so. Although advances in technology are sophisticated, we cannot ensure that it will always work on either your end or our end. If the connection with a video visit is poor, the visit may have to be switched to a telephone visit. With either a video or telephone visit, we are not always able to ensure that we have a secure connection.  By engaging in this virtual visit, you consent to the provision of healthcare and authorize for your insurance to be billed (if applicable) for the services provided during this visit. Depending on your insurance coverage, you may receive a charge related to this service.  I need to obtain your verbal consent now. Are you willing to proceed with your visit today? Erika Thomas has provided verbal consent on 12/19/2022 for a virtual visit (video or telephone). Margaretann Loveless, PA-C  Date: 12/19/2022 6:15 PM  Virtual Visit via Video Note   I, Margaretann Loveless, connected with  Erika Thomas  ([id number], [birth year]) on 12/19/22 at  6:00 PM EDT by a video-enabled telemedicine application and verified that I am speaking with the correct person using two identifiers.  Location: Patient: Virtual Visit Location  Patient: Home Provider: Virtual Visit Location Provider: Home Office   I discussed the limitations of evaluation and management by telemedicine and the availability of in person appointments. The patient expressed understanding and agreed to proceed.    History of Present Illness: Erika Thomas is a 42 y.o. who identifies as a female who was assigned female at birth, and is being seen today for medication refill. Has been on Citalopram for over 10 years per patient. Last refill was 09/11/22 Citalopram 20mg  #30 0 RF. She reports she has been taking, but was taking one tablet every 1-2 days to extend the prescription. She reports she was previously on Citalopram 40mg  not 20mg . Is asking for the increase. Last PCP was Ricky Stabs, NP with [facility]. This worked best for her job. 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 She also tries to get as many shifts as she can to help with finances.   Problems:  Patient Active Problem List   Diagnosis Date Noted   Hypokalemia 01/24/2021   Smoking 01/23/2021   Iron deficiency anemia, unspecified 01/23/2021   Acute metabolic encephalopathy 01/23/2021   Acute respiratory failure with hypoxia and hypercapnia (HCC) 01/21/2021   COPD with acute exacerbation (HCC) 01/20/2021   Influenza A 01/20/2021   Hypocalcemia 01/20/2021    Allergies: No Known Allergies Medications:  Current Outpatient Medications:    citalopram (CELEXA) 40 MG tablet, Take 1 tablet (40 mg total) by mouth daily., Disp: 60 tablet, Rfl:  0   Acetaminophen (TYLENOL EXTRA STRENGTH PO), Take 1,300 mg by mouth every 8 (eight) hours as needed (headache)., Disp: , Rfl:    albuterol (PROVENTIL) (2.5 MG/3ML) 0.083% nebulizer solution, Take 3 mLs (2.5 mg total) by nebulization every 2 (two) hours as needed for wheezing or shortness of breath., Disp: 75 mL, Rfl: 12   cetirizine (ZYRTEC) 10 MG tablet, Take 1 tablet (10 mg total) by mouth daily.,  Disp: 30 tablet, Rfl: 11   ferrous sulfate 325 (65 FE) MG tablet, Take 1 tablet (325 mg total) by mouth 2 (two) times daily with a meal., Disp: 60 tablet, Rfl: 3   hydrOXYzine (ATARAX) 25 MG tablet, Take 25 mg by mouth every 6 (six) hours as needed for anxiety., Disp: , Rfl:    mometasone-formoterol (DULERA) 100-5 MCG/ACT AERO, Inhale 2 puffs into the lungs 2 (two) times daily., Disp: 1 each, Rfl:    olopatadine (PATADAY) 0.1 % ophthalmic solution, Place 1 drop into both eyes 2 (two) times daily., Disp: 5 mL, Rfl: 0   trimethoprim-polymyxin b (POLYTRIM) ophthalmic solution, Apply 1-2 drops into affected eye QID x 5 days., Disp: 10 mL, Rfl: 0  Observations/Objective: Patient is well-developed, well-nourished in no acute distress.  Resting comfortably at home.  Head is normocephalic, atraumatic.  No labored breathing.  Speech is clear and coherent with logical content.  Patient is alert and oriented at baseline.    Assessment and Plan: 1. Anxiety - citalopram (CELEXA) 40 MG tablet; Take 1 tablet (40 mg total) by mouth daily.  Dispense: 60 tablet; Refill: 0  - Medication refilled - Advised patient this prescription, plus the last 30 day prescription she was given equals the 90 day supply the patient is allowed through the virtual department and that she must be seen in person, recommended by her PCP, before any future refills can be given; voiced understanding  Follow Up Instructions: I discussed the assessment and treatment plan with the patient. The patient was provided an opportunity to ask questions and all were answered. The patient agreed with the plan and demonstrated an understanding of the instructions.  A copy of instructions were sent to the patient via MyChart unless otherwise noted below.    The patient was advised to call back or seek an in-person evaluation if the symptoms worsen or if the condition fails to improve as anticipated.  Time:  I spent 10 minutes with the patient  via telehealth technology discussing the above problems/concerns.    Margaretann Loveless, PA-C

## 2022-12-23 NOTE — Telephone Encounter (Signed)
Patient not established and has no upcoming appointments at [facility]. Dr. Andrey Campanile please advise.

## 2023-02-15 IMAGING — CR DG CHEST 2V
2 series · 2 of 2 positions shown · non-contrast
Comparison: None.

CLINICAL DATA: Chest pain

EXAM:
CHEST - 2 VIEW

[w chest pa]
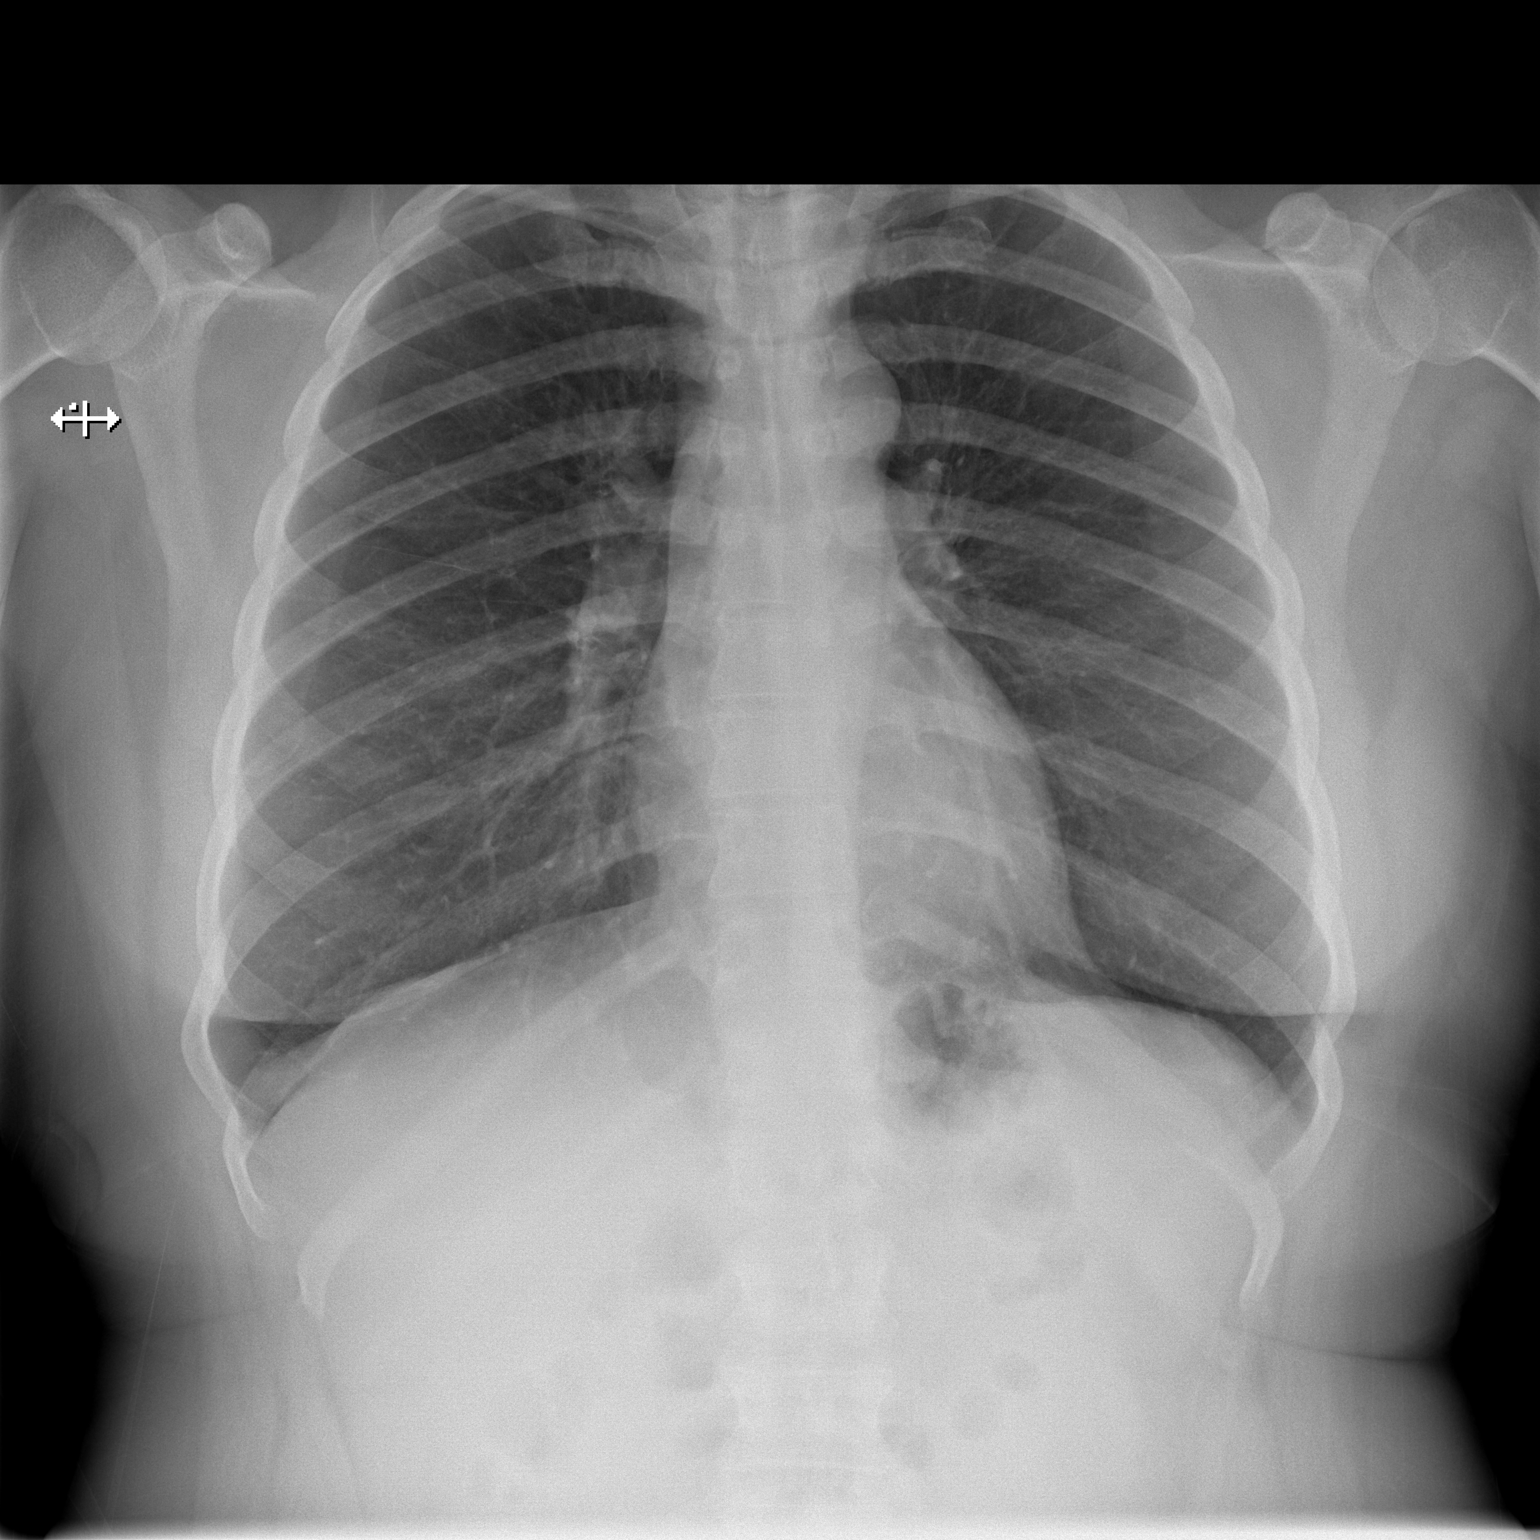

[w chest lat]
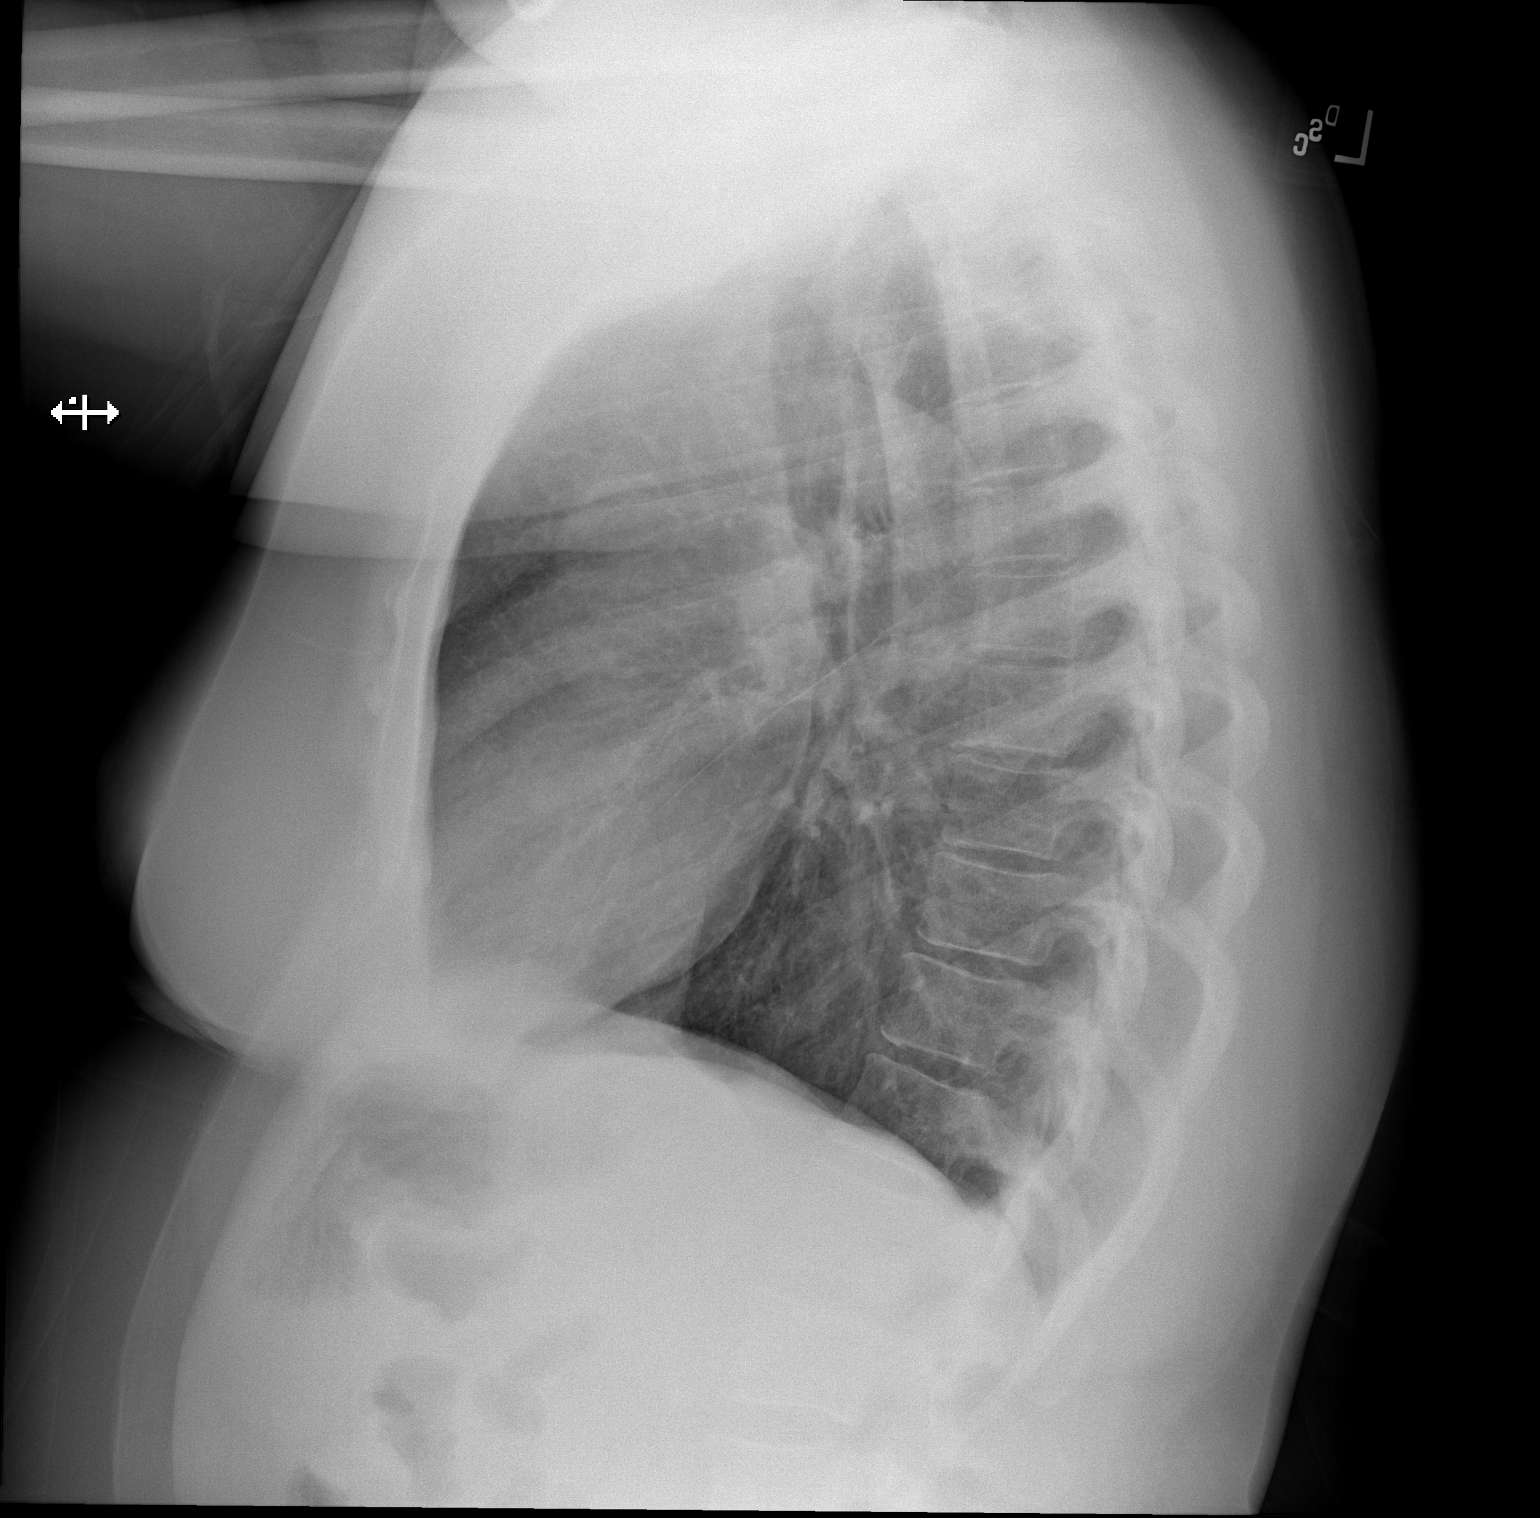

[2 of 2 positions shown; findings below may reference images not displayed]

FINDINGS: Lungs are clear. Heart size and pulmonary vascularity are normal. No
adenopathy. No pneumothorax. No bone lesions. Spina bifida occulta
noted at T1.
IMPRESSION: Lungs clear.  Heart size normal.

## 2023-03-15 DIAGNOSIS — R030 Elevated blood-pressure reading, without diagnosis of hypertension: Secondary | ICD-10-CM | POA: Diagnosis not present

## 2023-03-15 DIAGNOSIS — R112 Nausea with vomiting, unspecified: Secondary | ICD-10-CM | POA: Diagnosis not present

## 2023-10-01 ENCOUNTER — Telehealth: Admitting: Physician Assistant

## 2023-10-01 DIAGNOSIS — N760 Acute vaginitis: Secondary | ICD-10-CM

## 2023-10-01 DIAGNOSIS — B9689 Other specified bacterial agents as the cause of diseases classified elsewhere: Secondary | ICD-10-CM

## 2023-10-01 MED ORDER — METRONIDAZOLE 500 MG PO TABS: 500.0000 mg | ORAL_TABLET | Freq: Two times a day (BID) | ORAL | 0 refills | Status: AC

## 2023-10-01 NOTE — Progress Notes (Signed)
 Virtual Visit Consent   Erika Thomas, you are scheduled for a virtual visit with a [facility] provider today. Just as with appointments in the office, your consent must be obtained to participate. Your consent will be active for this visit and any virtual visit you may have with one of our providers in the next 365 days. If you have a MyChart account, a copy of this consent can be sent to you electronically.  As this is a virtual visit, video technology does not allow for your provider to perform a traditional examination. This may limit your provider's ability to fully assess your condition. If your provider identifies any concerns that need to be evaluated in person or the need to arrange testing (such as labs, EKG, etc.), we will make arrangements to do so. Although advances in technology are sophisticated, we cannot ensure that it will always work on either your end or our end. If the connection with a video visit is poor, the visit may have to be switched to a telephone visit. With either a video or telephone visit, we are not always able to ensure that we have a secure connection.  By engaging in this virtual visit, you consent to the provision of healthcare and authorize for your insurance to be billed (if applicable) for the services provided during this visit. Depending on your insurance coverage, you may receive a charge related to this service.  I need to obtain your verbal consent now. Are you willing to proceed with your visit today? [name] has provided verbal consent on 10/01/2023 for a virtual visit (video or telephone). Erika Thomas, [place]  Date: 10/01/2023 3:12 PM   Virtual Visit via Video Note   I, Erika Thomas, connected with  Glynn Freas  ([id number], Aug 09, 1980) on 10/01/23 at  3:15 PM EDT by a video-enabled telemedicine application and verified that I am speaking with the correct person using two identifiers.  Location: Patient: Virtual Visit Location  Patient: Home Provider: Virtual Visit Location Provider: Home Office   I discussed the limitations of evaluation and management by telemedicine and the availability of in person appointments. The patient expressed understanding and agreed to proceed.    History of Present Illness: Erika Thomas is a 43 y.o. who identifies as a female who was assigned female at birth, and is being seen today for lost medication.  Patient endorses recent diagnosis of bacterial vaginosis, for which she was placed on a 7-day course of metronidazole .  Patient states unfortunately she was only able to take a couple days of her medication before she lost it.  Is still having vaginal irritation, discharge and odor.  Wants to see if she can receive another prescription to complete her treatment course.  Denies fever, chills or pelvic pain.   HPI: HPI  Problems:  Patient Active Problem List   Diagnosis Date Noted   Hypokalemia 01/24/2021   Smoking 01/23/2021   Iron deficiency anemia, unspecified 01/23/2021   Acute metabolic encephalopathy 01/23/2021   Acute respiratory failure with hypoxia and hypercapnia (HCC) 01/21/2021   COPD with acute exacerbation (HCC) 01/20/2021   Influenza A 01/20/2021   Hypocalcemia 01/20/2021    Allergies: No Known Allergies Medications:  Current Outpatient Medications:    metroNIDAZOLE  (FLAGYL ) 500 MG tablet, Take 1 tablet (500 mg total) by mouth 2 (two) times daily., Disp: 14 tablet, Rfl: 0   Acetaminophen  (TYLENOL  EXTRA STRENGTH PO), Take 1,300 mg by mouth every 8 (eight) hours as needed (headache)., Disp: ,  Rfl:    albuterol  (PROVENTIL ) (2.5 MG/3ML) 0.083% nebulizer solution, Take 3 mLs (2.5 mg total) by nebulization every 2 (two) hours as needed for wheezing or shortness of breath., Disp: 75 mL, Rfl: 12   cetirizine  (ZYRTEC ) 10 MG tablet, Take 1 tablet (10 mg total) by mouth daily., Disp: 30 tablet, Rfl: 11   citalopram  (CELEXA ) 40 MG tablet, Take 1 tablet (40 mg total) by mouth  daily., Disp: 60 tablet, Rfl: 0   ferrous sulfate  325 (65 FE) MG tablet, Take 1 tablet (325 mg total) by mouth 2 (two) times daily with a meal., Disp: 60 tablet, Rfl: 3   hydrOXYzine (ATARAX) 25 MG tablet, Take 25 mg by mouth every 6 (six) hours as needed for anxiety., Disp: , Rfl:    mometasone -formoterol  (DULERA ) 100-5 MCG/ACT AERO, Inhale 2 puffs into the lungs 2 (two) times daily., Disp: 1 each, Rfl:    olopatadine  (PATADAY ) 0.1 % ophthalmic solution, Place 1 drop into both eyes 2 (two) times daily., Disp: 5 mL, Rfl: 0   trimethoprim -polymyxin b  (POLYTRIM ) ophthalmic solution, Apply 1-2 drops into affected eye QID x 5 days., Disp: 10 mL, Rfl: 0  Observations/Objective: Patient is well-developed, well-nourished in no acute distress.  Resting comfortably  at home.  Head is normocephalic, atraumatic.  No labored breathing.  Speech is clear and coherent with logical content.  Patient is alert and oriented at baseline.   Assessment and Plan: 1. BV (bacterial vaginosis) (Primary) - metroNIDAZOLE  (FLAGYL ) 500 MG tablet; Take 1 tablet (500 mg total) by mouth 2 (two) times daily.  Dispense: 14 tablet; Refill: 0  No concern for pregnancy.  Will send in a refill of metronidazole  500 mg twice daily for 7 days so she can complete a full treatment course for her illness.  In person precautions discussed.  Follow Up Instructions: I discussed the assessment and treatment plan with the patient. The patient was provided an opportunity to ask questions and all were answered. The patient agreed with the plan and demonstrated an understanding of the instructions.  A copy of instructions were sent to the patient via MyChart unless otherwise noted below.   The patient was advised to call back or seek an in-person evaluation if the symptoms worsen or if the condition fails to improve as anticipated.    Erika Velma Lunger, PA-C

## 2023-10-01 NOTE — Patient Instructions (Signed)
 Erika Thomas, thank you for joining [name], PA-C for today's virtual visit.  While this provider is not your primary care provider (PCP), if your PCP is located in our provider database this encounter information will be shared with them immediately following your visit.   A [facility] MyChart account gives you access to today's visit and all your visits, tests, and labs performed at [facility]  click here if you don't have a  MyChart account or go to [email or web address]  Consent: (Patient) Erika Thomas provided verbal consent for this virtual visit at the beginning of the encounter.  Current Medications:  Current Outpatient Medications:    Acetaminophen  (TYLENOL  EXTRA STRENGTH PO), Take 1,300 mg by mouth every 8 (eight) hours as needed (headache)., Disp: , Rfl:    albuterol  (PROVENTIL ) (2.5 MG/3ML) 0.083% nebulizer solution, Take 3 mLs (2.5 mg total) by nebulization every 2 (two) hours as needed for wheezing or shortness of breath., Disp: 75 mL, Rfl: 12   cetirizine  (ZYRTEC ) 10 MG tablet, Take 1 tablet (10 mg total) by mouth daily., Disp: 30 tablet, Rfl: 11   citalopram  (CELEXA ) 40 MG tablet, Take 1 tablet (40 mg total) by mouth daily., Disp: 60 tablet, Rfl: 0   ferrous sulfate  325 (65 FE) MG tablet, Take 1 tablet (325 mg total) by mouth 2 (two) times daily with a meal., Disp: 60 tablet, Rfl: 3   hydrOXYzine (ATARAX) 25 MG tablet, Take 25 mg by mouth every 6 (six) hours as needed for anxiety., Disp: , Rfl:    mometasone -formoterol  (DULERA ) 100-5 MCG/ACT AERO, Inhale 2 puffs into the lungs 2 (two) times daily., Disp: 1 each, Rfl:    olopatadine  (PATADAY ) 0.1 % ophthalmic solution, Place 1 drop into both eyes 2 (two) times daily., Disp: 5 mL, Rfl: 0   trimethoprim -polymyxin b  (POLYTRIM ) ophthalmic solution, Apply 1-2 drops into affected eye QID x 5 days., Disp: 10 mL, Rfl: 0   Medications ordered in this encounter:  No orders of the defined types  were placed in this encounter.    *If you need refills on other medications prior to your next appointment, please contact your pharmacy*  Follow-Up: Call back or seek an in-person evaluation if the symptoms worsen or if the condition fails to improve as anticipated.  [facility] [phone]  Other Instructions Vaginal Infection (Bacterial Vaginosis): What to Know  Bacterial vaginosis is an infection of the vagina. It happens when the balance of normal germs (bacteria) in the vagina changes. If you don't get treated, it can make it easier for you to get other infections from sex. These are called sexually transmitted infections (STIs). If you're pregnant, you need to get treated right away. This infection can cause a baby to be born early or at a low birth weight. What are the causes? This infection happens when too many harmful germs grow in the vagina. You can't get this infection from toilet seats, bedsheets, swimming pools, or things that touch your vagina. What increases the risk? Having sex with a new person or more than one person. Having sex without protection. Douching. Having an intrauterine device (IUD). Smoking. Using drugs or drinking alcohol. These can lead you to do risky things. Taking certain antibiotics. Being pregnant. What are the signs or symptoms? Some females have no symptoms. Symptoms may include: A gray or white discharge from your vagina. It can be watery or foamy. A fishy smell. This can happen after sex or during your menstrual period.  Itching in and around your vagina. Burning or pain when you pee. How is this treated? This infection is treated with antibiotics. These may be given to you as: A pill. A cream for your vagina. A medicine that you put into your vagina (suppository). If the infection comes back, you may need more antibiotics. Follow these instructions at home: Medicines Take your medicines as told. Take or use your  antibiotics as told. Do not stop using them even if you start to feel better. General instructions If the person you have sex with is a female, tell her that you have this infection. She will need to follow up with her doctor. Female partners don't need to be treated. Do not have sex until you finish treatment. Drink more fluids as told. Keep your vagina and butt clean. Wash these areas with warm water each day. Wipe from front to back after you poop. If you're breastfeeding a baby, talk to your doctor if you should keep doing so during treatment. How is this prevented? Self-care Do not douche. Do not use deodorant sprays on your vagina. Wear cotton underwear. Do not wear tight pants and pantyhose, especially in the summer. Safe sex Use condoms the correct way and every time you have sex. Use dental dams to protect yourself during oral sex. Limit how many people you have sex with. Get tested for STIs. The person you have sex with should also get tested. Drugs and alcohol Do not smoke, vape, or use nicotine  or tobacco. Do not use drugs. Limit the amount of alcohol you drink because it can lead you to do risky things. Where to find more information To learn more: Go to TonerPromos.no. Click Health Topics A-Z. Type bacterial vaginosis in the search bar. American Sexual Health Association (ASHA): ashasexualhealth.org U.S. Department of Health and [employer], Office on Women's Health: TravelLesson.ca Contact a doctor if: Your symptoms don't get better, even after treatment. You have more discharge or pain when you pee. You have a fever or chills. You have pain in your belly or in the area between your hips. You have pain during sex. You bleed from your vagina between menstrual periods. This information is not intended to replace advice given to you by your health care provider. Make sure you discuss any questions you have with your health care provider. Document Revised: 09/03/2022  Document Reviewed: 09/03/2022 Elsevier Patient Education  2024 Elsevier Inc.   If you have been instructed to have an in-person evaluation today at a local Urgent Care facility, please use the link below. It will take you to a list of all of our available [facility], including address, phone number and hours of operation. Please do not delay care.  [facility]  If you or a family member do not have a primary care provider, use the link below to schedule a visit and establish care. When you choose a [facility] primary care physician or advanced practice provider, you gain a long-term partner in health. Find a Primary Care Provider  Learn more about [facility]'s in-office and virtual care options: [place] [facility] - Get Care Now

## 2023-11-15 ENCOUNTER — Telehealth: Admitting: Nurse Practitioner

## 2023-11-15 DIAGNOSIS — N939 Abnormal uterine and vaginal bleeding, unspecified: Secondary | ICD-10-CM

## 2023-11-15 NOTE — Patient Instructions (Signed)
  Erika Thomas, thank you for joining Haze LELON Servant, NP for today's virtual visit.  While this provider is not your primary care provider (PCP), if your PCP is located in our provider database this encounter information will be shared with them immediately following your visit.   A [facility] MyChart account gives you access to today's visit and all your visits, tests, and labs performed at [facility]  click here if you don't have a [facility] MyChart account or go to [email or web address]  Consent: (Patient) Erika Thomas provided verbal consent for this virtual visit at the beginning of the encounter.  Current Medications:  Current Outpatient Medications:    Acetaminophen  (TYLENOL  EXTRA STRENGTH PO), Take 1,300 mg by mouth every 8 (eight) hours as needed (headache)., Disp: , Rfl:    albuterol  (PROVENTIL ) (2.5 MG/3ML) 0.083% nebulizer solution, Take 3 mLs (2.5 mg total) by nebulization every 2 (two) hours as needed for wheezing or shortness of breath., Disp: 75 mL, Rfl: 12   cetirizine  (ZYRTEC ) 10 MG tablet, Take 1 tablet (10 mg total) by mouth daily., Disp: 30 tablet, Rfl: 11   citalopram  (CELEXA ) 40 MG tablet, Take 1 tablet (40 mg total) by mouth daily., Disp: 60 tablet, Rfl: 0   ferrous sulfate  325 (65 FE) MG tablet, Take 1 tablet (325 mg total) by mouth 2 (two) times daily with a meal., Disp: 60 tablet, Rfl: 3   hydrOXYzine (ATARAX) 25 MG tablet, Take 25 mg by mouth every 6 (six) hours as needed for anxiety., Disp: , Rfl:    metroNIDAZOLE  (FLAGYL ) 500 MG tablet, Take 1 tablet (500 mg total) by mouth 2 (two) times daily., Disp: 14 tablet, Rfl: 0   mometasone -formoterol  (DULERA ) 100-5 MCG/ACT AERO, Inhale 2 puffs into the lungs 2 (two) times daily., Disp: 1 each, Rfl:    olopatadine  (PATADAY ) 0.1 % ophthalmic solution, Place 1 drop into both eyes 2 (two) times daily., Disp: 5 mL, Rfl: 0   trimethoprim -polymyxin b  (POLYTRIM ) ophthalmic solution, Apply 1-2 drops into  affected eye QID x 5 days., Disp: 10 mL, Rfl: 0   Medications ordered in this encounter:  No orders of the defined types were placed in this encounter.    *If you need refills on other medications prior to your next appointment, please contact your pharmacy*  Follow-Up: Call back or seek an in-person evaluation if the symptoms worsen or if the condition fails to improve as anticipated.  [facility] [phone]  Other Instructions Follow up with PCP Work note given today   If you have been instructed to have an in-person evaluation today at a local Urgent Care facility, please use the link below. It will take you to a list of all of our available [facility], including address, phone number and hours of operation. Please do not delay care.  [facility]  If you or a family member do not have a primary care provider, use the link below to schedule a visit and establish care. When you choose a [facility] primary care physician or advanced practice provider, you gain a long-term partner in health. Find a Primary Care Provider  Learn more about [facility]'s in-office and virtual care options: [facility] - Get Care Now

## 2023-11-15 NOTE — Progress Notes (Unsigned)
 Virtual Visit Consent   [name], you are scheduled for a virtual visit with a [facility] provider today. Just as with appointments in the office, your consent must be obtained to participate. Your consent will be active for this visit and any virtual visit you may have with one of our providers in the next 365 days. If you have a MyChart account, a copy of this consent can be sent to you electronically.  As this is a virtual visit, video technology does not allow for your provider to perform a traditional examination. This may limit your provider's ability to fully assess your condition. If your provider identifies any concerns that need to be evaluated in person or the need to arrange testing (such as labs, EKG, etc.), we will make arrangements to do so. Although advances in technology are sophisticated, we cannot ensure that it will always work on either your end or our end. If the connection with a video visit is poor, the visit may have to be switched to a telephone visit. With either a video or telephone visit, we are not always able to ensure that we have a secure connection.  By engaging in this virtual visit, you consent to the provision of healthcare and authorize for your insurance to be billed (if applicable) for the services provided during this visit. Depending on your insurance coverage, you may receive a charge related to this service.  I need to obtain your verbal consent now. Are you willing to proceed with your visit today? Erika Thomas has provided verbal consent on 11/15/2023 for a virtual visit (video or telephone). Haze LELON Servant, NP  Date: 11/15/2023 2:43 PM   Virtual Visit via Video Note   I, Haze LELON Servant, connected with  [name]  ([id number], 01-24-1981) on 11/15/23 at  2:15 PM EDT by a video-enabled telemedicine application and verified that I am speaking with the correct person using two identifiers.  Location: Patient: Virtual Visit Location Patient:  Home Provider: Virtual Visit Location Provider: Home Office   I discussed the limitations of evaluation and management by telemedicine and the availability of in person appointments. The patient expressed understanding and agreed to proceed.    History of Present Illness: Erika Thomas is a 43 y.o. who identifies as a female who was assigned female at birth, and is being seen today for abnormal uterine bleeding.  Ms Heaps states for the past several months she has been experiencing heavy uterine bleeding. She is requesting treatment for this today. Was unable to go to work due to the excessive bleeding and soaking through her clothes.   Problems:  Patient Active Problem List   Diagnosis Date Noted   Hypokalemia 01/24/2021   Smoking 01/23/2021   Iron deficiency anemia, unspecified 01/23/2021   Acute metabolic encephalopathy 01/23/2021   Acute respiratory failure with hypoxia and hypercapnia (HCC) 01/21/2021   COPD with acute exacerbation (HCC) 01/20/2021   Influenza A 01/20/2021   Hypocalcemia 01/20/2021    Allergies: No Known Allergies Medications:  Current Outpatient Medications:    Acetaminophen  (TYLENOL  EXTRA STRENGTH PO), Take 1,300 mg by mouth every 8 (eight) hours as needed (headache)., Disp: , Rfl:    albuterol  (PROVENTIL ) (2.5 MG/3ML) 0.083% nebulizer solution, Take 3 mLs (2.5 mg total) by nebulization every 2 (two) hours as needed for wheezing or shortness of breath., Disp: 75 mL, Rfl: 12   cetirizine  (ZYRTEC ) 10 MG tablet, Take 1 tablet (10 mg total) by mouth daily., Disp: 30 tablet, Rfl: 11  citalopram  (CELEXA ) 40 MG tablet, Take 1 tablet (40 mg total) by mouth daily., Disp: 60 tablet, Rfl: 0   ferrous sulfate  325 (65 FE) MG tablet, Take 1 tablet (325 mg total) by mouth 2 (two) times daily with a meal., Disp: 60 tablet, Rfl: 3   hydrOXYzine (ATARAX) 25 MG tablet, Take 25 mg by mouth every 6 (six) hours as needed for anxiety., Disp: , Rfl:    metroNIDAZOLE  (FLAGYL ) 500 MG  tablet, Take 1 tablet (500 mg total) by mouth 2 (two) times daily., Disp: 14 tablet, Rfl: 0   mometasone -formoterol  (DULERA ) 100-5 MCG/ACT AERO, Inhale 2 puffs into the lungs 2 (two) times daily., Disp: 1 each, Rfl:    olopatadine  (PATADAY ) 0.1 % ophthalmic solution, Place 1 drop into both eyes 2 (two) times daily., Disp: 5 mL, Rfl: 0   trimethoprim -polymyxin b  (POLYTRIM ) ophthalmic solution, Apply 1-2 drops into affected eye QID x 5 days., Disp: 10 mL, Rfl: 0  Observations/Objective: Patient is well-developed, well-nourished in no acute distress.  Resting comfortably at home.  Head is normocephalic, atraumatic.  No labored breathing.  Speech is clear and coherent with logical content.  Patient is alert and oriented at baseline.    Assessment and Plan: 1. Abnormal uterine bleeding (AUB) (Primary) Follow up with PCP Work note given today  Follow Up Instructions: I discussed the assessment and treatment plan with the patient. The patient was provided an opportunity to ask questions and all were answered. The patient agreed with the plan and demonstrated an understanding of the instructions.  A copy of instructions were sent to the patient via MyChart unless otherwise noted below.    The patient was advised to call back or seek an in-person evaluation if the symptoms worsen or if the condition fails to improve as anticipated.    Cintya Daughety W Jakhari Space, NP

## 2023-11-22 IMAGING — US US OB < 14 WEEKS - US OB TV
1 series · 15 of 28 positions shown · non-contrast
Comparison: None.

CLINICAL DATA: Vaginal bleeding in 1st trimester pregnancy.

EXAM:
OBSTETRIC <14 WK US AND TRANSVAGINAL OB US
TECHNIQUE: Both transabdominal and transvaginal ultrasound examinations were
performed for complete evaluation of the gestation as well as the
maternal uterus, adnexal regions, and pelvic cul-de-sac.
Transvaginal technique was performed to assess early pregnancy.

[Series 1: us ob < 14 weeks - us ob tv · 15 of 91 slices shown]
[im 1/91]
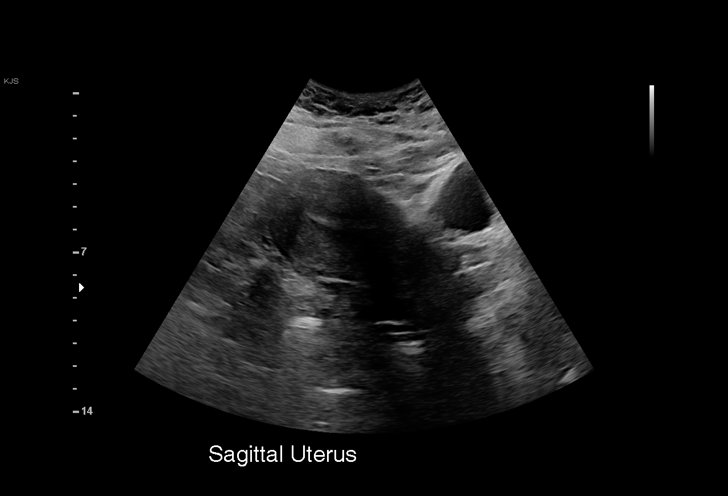
[im 7/91]
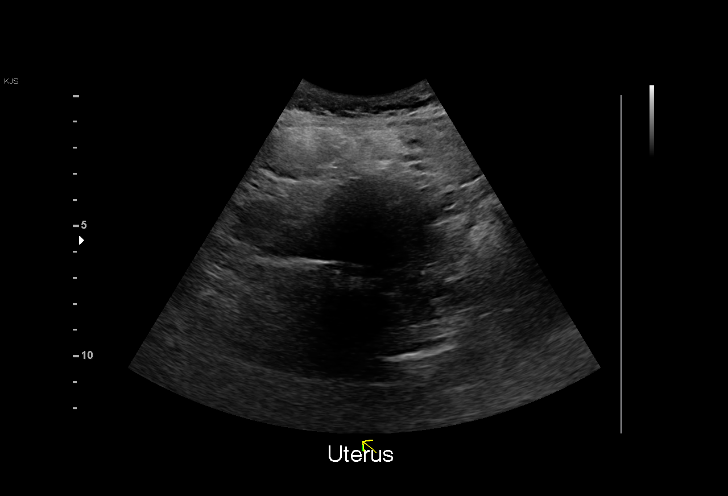
[im 14/91]
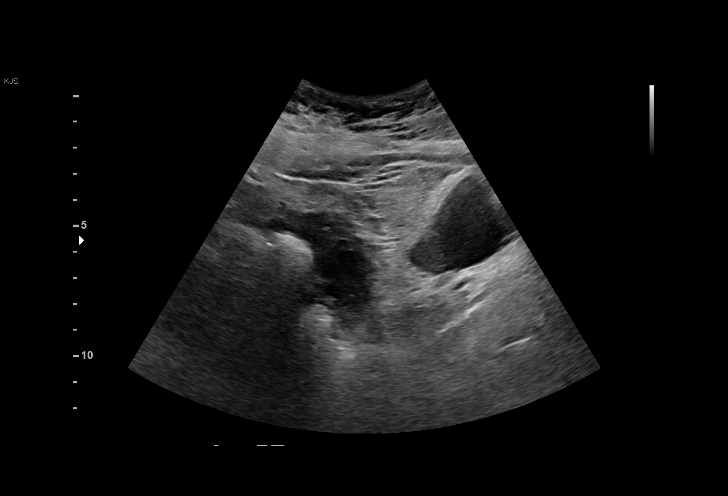
[im 21/91]
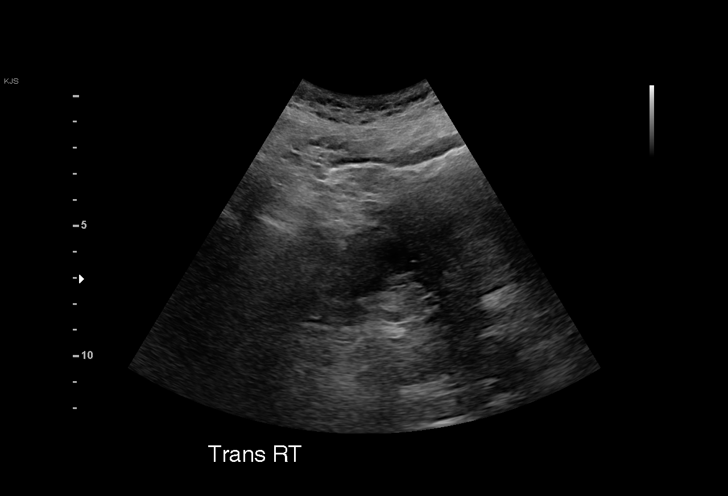
[im 27/91]
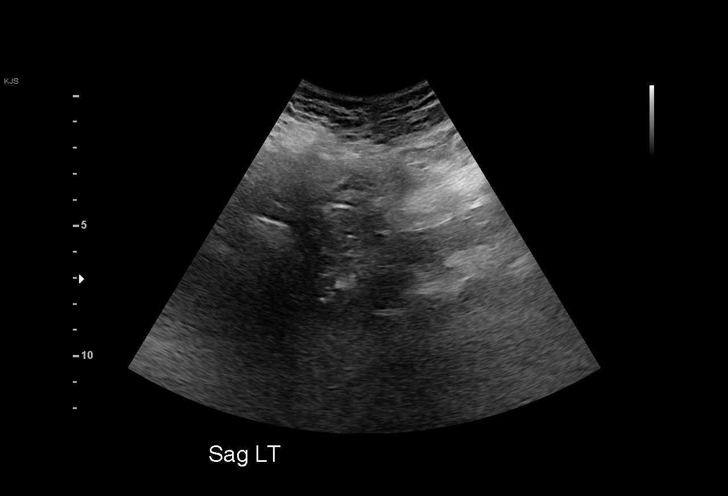
[im 34/91]
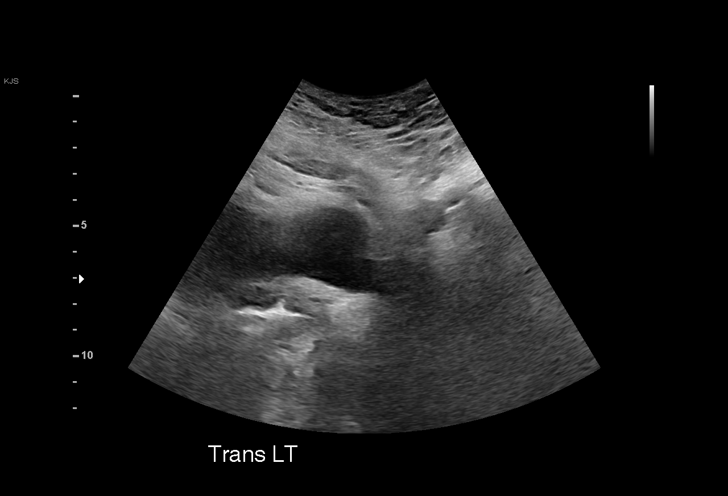
[im 41/91]
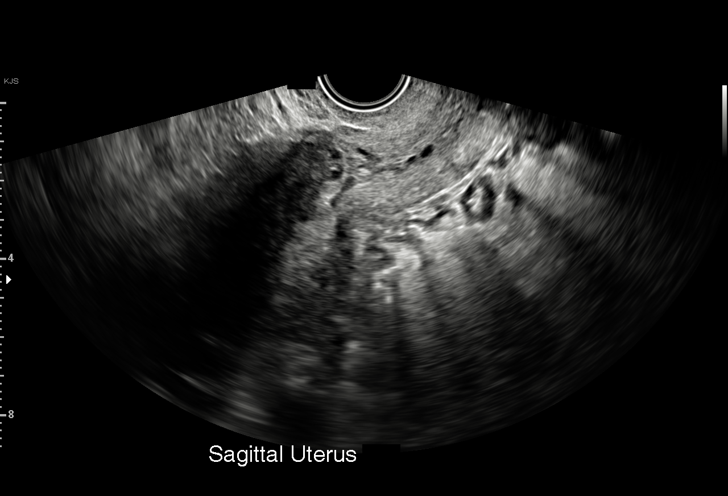
[im 47/91]
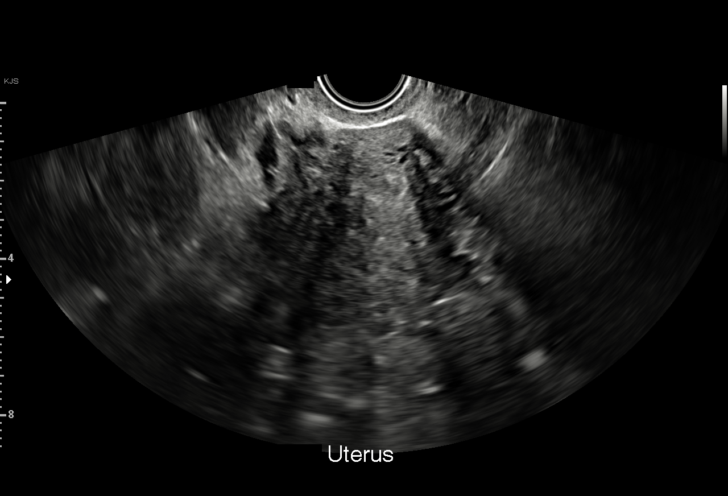
[im 51/91]
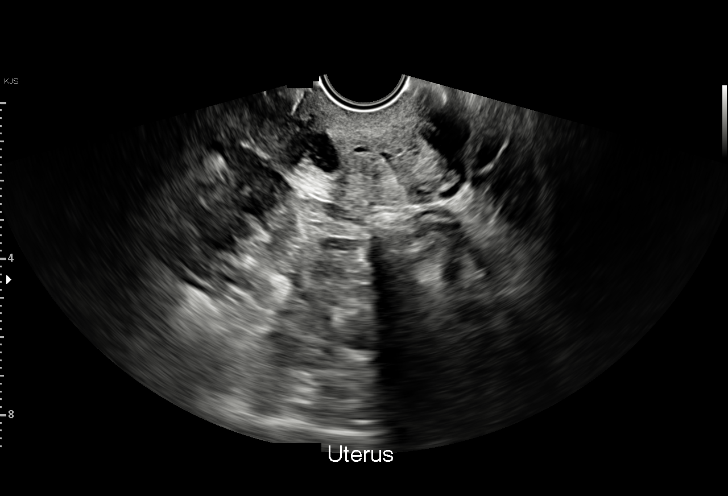
[im 57/91]
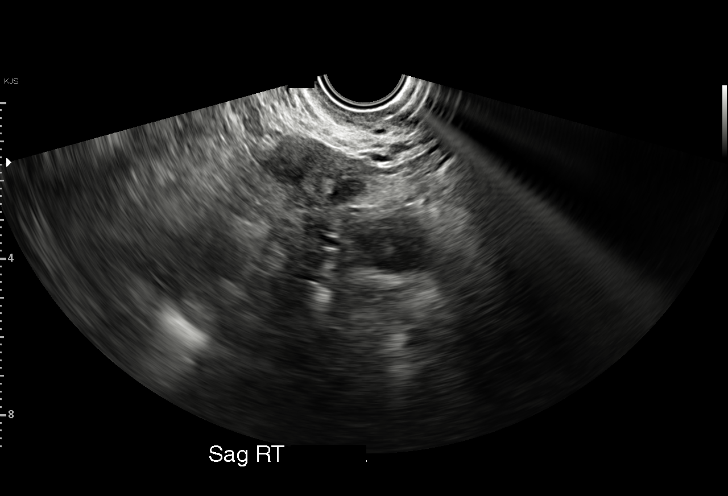
[im 64/91]
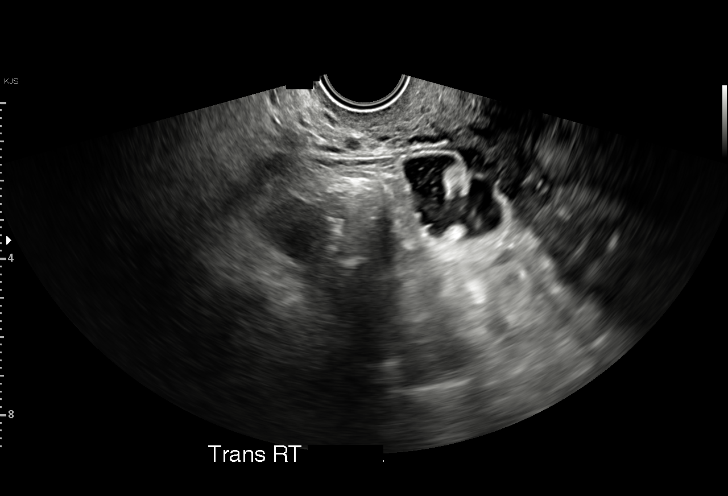
[im 71/91]
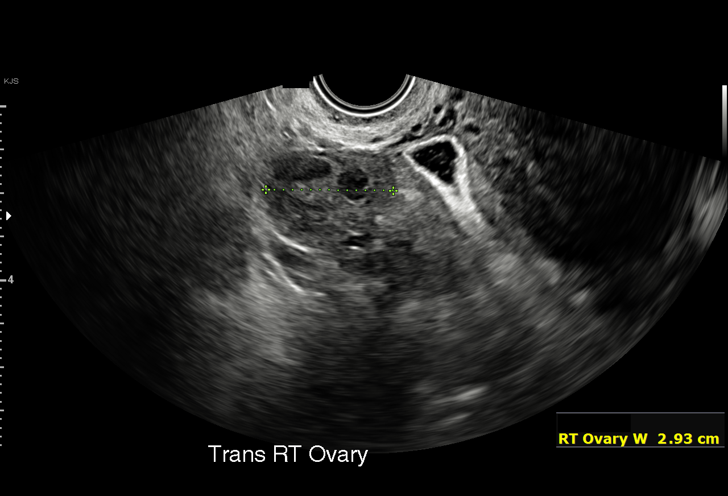
[im 77/91]
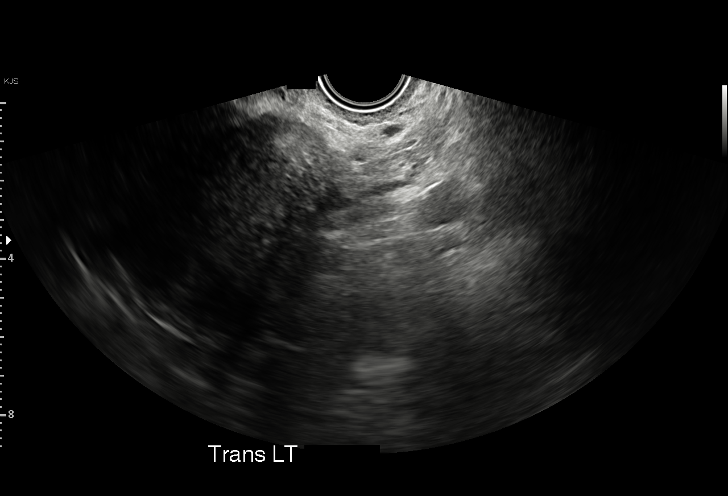
[im 84/91]
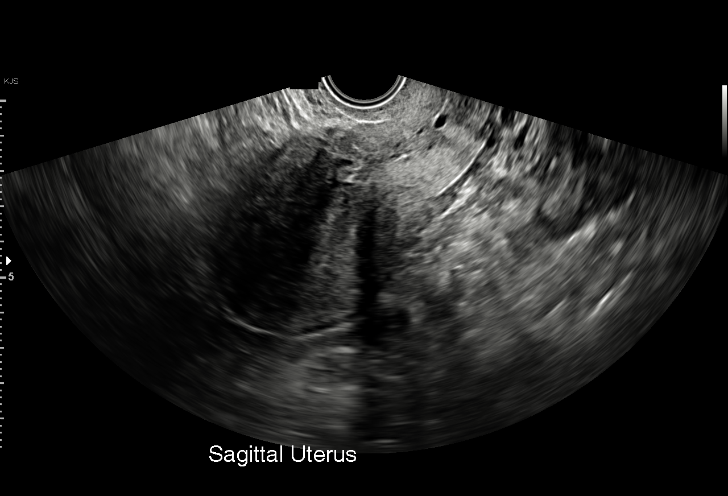
[im 91/91]
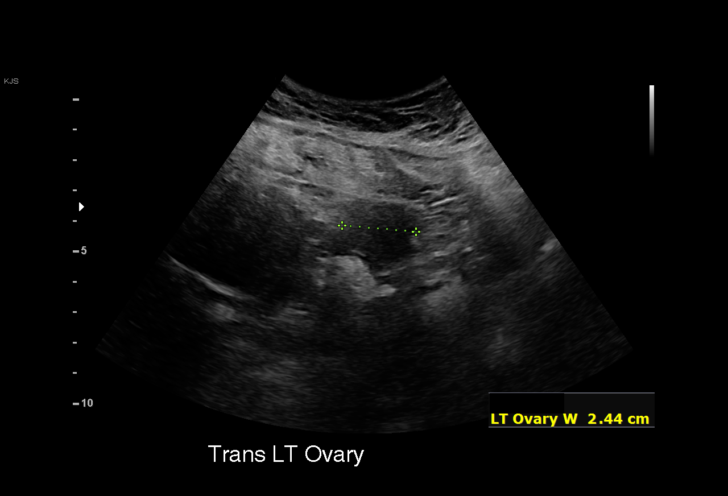

[15 of 28 positions shown; findings below may reference images not displayed]

FINDINGS: Intrauterine gestational sac: None

Maternal uterus/adnexae: No fibroids identified. Both ovaries are
normal in appearance. No evidence of adnexal mass or abnormal free
fluid.
IMPRESSION: Pregnancy of unknown anatomic location (no intrauterine gestational
sac or adnexal mass identified). Differential diagnosis includes
recent spontaneous abortion, IUP too early to visualize, and
non-visualized ectopic pregnancy. Recommend correlation with serial
beta-hCG levels, and follow up US if warranted clinically.

## 2023-11-26 ENCOUNTER — Telehealth

## 2023-11-26 DIAGNOSIS — F418 Other specified anxiety disorders: Secondary | ICD-10-CM

## 2023-11-26 NOTE — Progress Notes (Unsigned)
 Virtual Visit Consent   Edina Winningham, you are scheduled for a virtual visit with a [facility] provider today. Just as with appointments in the office, your consent must be obtained to participate. Your consent will be active for this visit and any virtual visit you may have with one of our providers in the next 365 days. If you have a MyChart account, a copy of this consent can be sent to you electronically.  As this is a virtual visit, video technology does not allow for your provider to perform a traditional examination. This may limit your provider's ability to fully assess your condition. If your provider identifies any concerns that need to be evaluated in person or the need to arrange testing (such as labs, EKG, etc.), we will make arrangements to do so. Although advances in technology are sophisticated, we cannot ensure that it will always work on either your end or our end. If the connection with a video visit is poor, the visit may have to be switched to a telephone visit. With either a video or telephone visit, we are not always able to ensure that we have a secure connection.  By engaging in this virtual visit, you consent to the provision of healthcare and authorize for your insurance to be billed (if applicable) for the services provided during this visit. Depending on your insurance coverage, you may receive a charge related to this service.  I need to obtain your verbal consent now. Are you willing to proceed with your visit today? Erika Thomas has provided verbal consent on 11/27/2023 for a virtual visit (video or telephone). Delon CHRISTELLA Dickinson, PA-C  Date: 11/27/2023 9:48 AM   Virtual Visit via Video Note   I, Delon CHRISTELLA Dickinson, connected with  Erika Thomas  ([id number], [birth year]) on 11/27/23 at  9:30 AM EDT by a video-enabled telemedicine application and verified that I am speaking with the correct person using two identifiers.  Location: Patient: Virtual Visit Location  Patient: Home Provider: Virtual Visit Location Provider: Home Office   I discussed the limitations of evaluation and management by telemedicine and the availability of in person appointments. The patient expressed understanding and agreed to proceed.    History of Present Illness: Erika Thomas is a 43 y.o. who identifies as a female who was assigned female at birth, and is being seen today for worsening anxiety. Patient is on Citalopram  40mg  daily and reports still with occasional panic attacks. She does have a PCP but has not seen them recently.   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 This caused her to have a panic attack and had to leave work. She is requesting a note to state she is unable to work on any locked unit due to anxiety.   Problems:  Patient Active Problem List   Diagnosis Date Noted   Hypokalemia 01/24/2021   Smoking 01/23/2021   Iron deficiency anemia, unspecified 01/23/2021   Acute metabolic encephalopathy 01/23/2021   Acute respiratory failure with hypoxia and hypercapnia (HCC) 01/21/2021   COPD with acute exacerbation (HCC) 01/20/2021   Influenza A 01/20/2021   Hypocalcemia 01/20/2021    Allergies: No Known Allergies Medications:  Current Outpatient Medications:    Acetaminophen  (TYLENOL  EXTRA STRENGTH PO), Take 1,300 mg by mouth every 8 (eight) hours as needed (headache)., Disp: , Rfl:    albuterol  (PROVENTIL ) (2.5 MG/3ML) 0.083% nebulizer solution, Take 3 mLs (2.5 mg total) by nebulization every 2 (two) hours as needed for wheezing  or shortness of breath., Disp: 75 mL, Rfl: 12   cetirizine  (ZYRTEC ) 10 MG tablet, Take 1 tablet (10 mg total) by mouth daily., Disp: 30 tablet, Rfl: 11   citalopram  (CELEXA ) 40 MG tablet, Take 1 tablet (40 mg total) by mouth daily., Disp: 60 tablet, Rfl: 0   ferrous sulfate  325 (65 FE) MG tablet, Take 1 tablet (325 mg total) by mouth 2 (two) times daily with a meal., Disp: 60 tablet, Rfl: 3    hydrOXYzine (ATARAX) 25 MG tablet, Take 25 mg by mouth every 6 (six) hours as needed for anxiety., Disp: , Rfl:    metroNIDAZOLE  (FLAGYL ) 500 MG tablet, Take 1 tablet (500 mg total) by mouth 2 (two) times daily., Disp: 14 tablet, Rfl: 0   mometasone -formoterol  (DULERA ) 100-5 MCG/ACT AERO, Inhale 2 puffs into the lungs 2 (two) times daily., Disp: 1 each, Rfl:    olopatadine  (PATADAY ) 0.1 % ophthalmic solution, Place 1 drop into both eyes 2 (two) times daily., Disp: 5 mL, Rfl: 0   trimethoprim -polymyxin b  (POLYTRIM ) ophthalmic solution, Apply 1-2 drops into affected eye QID x 5 days., Disp: 10 mL, Rfl: 0  Observations/Objective: Patient is well-developed, well-nourished in no acute distress.  Resting comfortably  Head is normocephalic, atraumatic.  No labored breathing.  Speech is clear and coherent with logical content.  Patient is alert and oriented at baseline.    Assessment and Plan: 1. Situational anxiety (Primary)  - Advised we are unable to provide a restrictive note as we do not manage her anxiety, but could provide a note to excuse her for today - Advised to continue Citalopram  and follow up with PCP - [facility] and that [facility]. Gave patient address and information with hours of [facility] availability for her to follow up there - Work note provided  Follow Up Instructions: I discussed the assessment and treatment plan with the patient. The patient was provided an opportunity to ask questions and all were answered. The patient agreed with the plan and demonstrated an understanding of the instructions.  A copy of instructions were sent to the patient via MyChart unless otherwise noted below.    The patient was advised to call back or seek an in-person evaluation if the symptoms worsen or if the condition fails to improve as anticipated.    Delon CHRISTELLA Dickinson, PA-C

## 2023-11-27 NOTE — Patient Instructions (Signed)
 Erika Thomas, thank you for joining Delon CHRISTELLA Dickinson, PA-C for today's virtual visit.  While this provider is not your primary care provider (PCP), if your PCP is located in our provider database this encounter information will be shared with them immediately following your visit.   A [facility] MyChart account gives you access to today's visit and all your visits, tests, and labs performed at [facility]  click here if you don't have a [facility] MyChart account or go to [email or web address]  Consent: (Patient) Erika Thomas provided verbal consent for this virtual visit at the beginning of the encounter.  Current Medications:  Current Outpatient Medications:    Acetaminophen  (TYLENOL  EXTRA STRENGTH PO), Take 1,300 mg by mouth every 8 (eight) hours as needed (headache)., Disp: , Rfl:    albuterol  (PROVENTIL ) (2.5 MG/3ML) 0.083% nebulizer solution, Take 3 mLs (2.5 mg total) by nebulization every 2 (two) hours as needed for wheezing or shortness of breath., Disp: 75 mL, Rfl: 12   cetirizine  (ZYRTEC ) 10 MG tablet, Take 1 tablet (10 mg total) by mouth daily., Disp: 30 tablet, Rfl: 11   citalopram  (CELEXA ) 40 MG tablet, Take 1 tablet (40 mg total) by mouth daily., Disp: 60 tablet, Rfl: 0   ferrous sulfate  325 (65 FE) MG tablet, Take 1 tablet (325 mg total) by mouth 2 (two) times daily with a meal., Disp: 60 tablet, Rfl: 3   hydrOXYzine (ATARAX) 25 MG tablet, Take 25 mg by mouth every 6 (six) hours as needed for anxiety., Disp: , Rfl:    metroNIDAZOLE  (FLAGYL ) 500 MG tablet, Take 1 tablet (500 mg total) by mouth 2 (two) times daily., Disp: 14 tablet, Rfl: 0   mometasone -formoterol  (DULERA ) 100-5 MCG/ACT AERO, Inhale 2 puffs into the lungs 2 (two) times daily., Disp: 1 each, Rfl:    olopatadine  (PATADAY ) 0.1 % ophthalmic solution, Place 1 drop into both eyes 2 (two) times daily., Disp: 5 mL, Rfl: 0   trimethoprim -polymyxin b  (POLYTRIM ) ophthalmic solution, Apply 1-2 drops  into affected eye QID x 5 days., Disp: 10 mL, Rfl: 0   Medications ordered in this encounter:  No orders of the defined types were placed in this encounter.    *If you need refills on other medications prior to your next appointment, please contact your pharmacy*  Follow-Up: Call back or seek an in-person evaluation if the symptoms worsen or if the condition fails to improve as anticipated.   Virtual Care [phone]  Other Instructions Managing Anxiety, Adult After being diagnosed with anxiety, you may be relieved to know why you have felt or behaved a certain way. You may also feel overwhelmed about the treatment ahead and what it will mean for your life. With care and support, you can manage your anxiety. How to manage lifestyle changes Understanding the difference between stress and anxiety Although stress can play a role in anxiety, it is not the same as anxiety. Stress is your body's reaction to life changes and events, both good and bad. Stress is often caused by something external, such as a deadline, test, or competition. It normally goes away after the event has ended and will last just a few hours. But, stress can be ongoing and can lead to more than just stress. Anxiety is caused by something internal, such as imagining a terrible outcome or worrying that something will go wrong that will greatly upset you. Anxiety often does not go away even after the event is over, and it can become a  long-term (chronic) worry. Lowering stress and anxiety Talk with your health care provider or a counselor to learn more about lowering anxiety and stress. They may suggest tension-reduction techniques, such as: Music. Spend time creating or listening to music that you enjoy and that inspires you. Mindfulness-based meditation. Practice being aware of your normal breaths while not trying to control your breathing. It can be done while sitting or walking. Centering prayer. Focus on a  word, phrase, or sacred image that means something to you and brings you peace. Deep breathing. Expand your stomach and inhale slowly through your nose. Hold your breath for 3-5 seconds. Then breathe out slowly, letting your stomach muscles relax. Self-talk. Learn to notice and spot thought patterns that lead to anxiety reactions. Change those patterns to thoughts that feel peaceful. Muscle relaxation. Take time to tense muscles and then relax them. Choose a tension-reduction technique that fits your lifestyle and personality. These techniques take time and practice. Set aside 5-15 minutes a day to do them. Specialized therapists can offer counseling and training in these techniques. The training to help with anxiety may be covered by some insurance plans. Other things you can do to manage stress and anxiety include: Keeping a stress diary. This can help you learn what triggers your reaction and then learn ways to manage your response. Thinking about how you react to certain situations. You may not be able to control everything, but you can control your response. Making time for activities that help you relax and not feeling guilty about spending your time in this way. Doing visual imagery. This involves imagining or creating mental pictures to help you relax. Practicing yoga. Through yoga poses, you can lower tension and relax.  Medicines Medicines for anxiety include: Antidepressant medicines. These are usually prescribed for long-term daily control. Anti-anxiety medicines. These may be added in severe cases, especially when panic attacks occur. When used together, medicines, psychotherapy, and tension-reduction techniques may be the most effective treatment. Relationships Relationships can play a big part in helping you recover. Spend more time connecting with trusted friends and family members. Think about going to couples counseling if you have a partner, taking family education classes, or  going to family therapy. Therapy can help you and others better understand your anxiety. How to recognize changes in your anxiety Everyone responds differently to treatment for anxiety. Recovery from anxiety happens when symptoms lessen and stop interfering with your daily life at home or work. This may mean that you will start to: Have better concentration and focus. Worry will interfere less in your daily thinking. Sleep better. Be less irritable. Have more energy. Have improved memory. Try to recognize when your condition is getting worse. Contact your provider if your symptoms interfere with home or work and you feel like your condition is not improving. Follow these instructions at home: Activity Exercise. Adults should: Exercise for at least 150 minutes each week. The exercise should increase your heart rate and make you sweat (moderate-intensity exercise). Do strengthening exercises at least twice a week. Get the right amount and quality of sleep. Most adults need 7-9 hours of sleep each night. Lifestyle  Eat a healthy diet that includes plenty of vegetables, fruits, whole grains, low-fat dairy products, and lean protein. Do not eat a lot of foods that are high in fats, added sugars, or salt (sodium). Make choices that simplify your life. Do not use any products that contain nicotine  or tobacco. These products include cigarettes, chewing tobacco, and vaping devices,  such as e-cigarettes. If you need help quitting, ask your provider. Avoid caffeine, alcohol, and certain over-the-counter cold medicines. These may make you feel worse. Ask your pharmacist which medicines to avoid. General instructions Take over-the-counter and prescription medicines only as told by your provider. Keep all follow-up visits. This is to make sure you are managing your anxiety well or if you need more support. Where to find support You can get help and support from: Self-help groups. Online and [employer] A trusted spiritual leader. Couples counseling. Family education classes. Family therapy. Where to find more information You may find that joining a support group helps you deal with your anxiety. The following sources can help you find counselors or support groups near you: Mental Health America: mentalhealthamerica.net Anxiety and Depression Association of [place] (ADAA): adaa.org The [employer] on Mental Illness (NAMI): nami.org Contact a health care provider if: You have a hard time staying focused or finishing tasks. You spend many hours a day feeling worried about everyday life. You are very tired because you cannot stop worrying. You start to have headaches or often feel tense. You have chronic nausea or diarrhea. Get help right away if: Your heart feels like it is racing. You have shortness of breath. You have thoughts of hurting yourself or others. Get help right away if you feel like you may hurt yourself or others, or have thoughts about taking your own life. Go to your nearest emergency room or: Call 911. Call the National Suicide Prevention Lifeline at 7741265743 or 988. This is open 24 hours a day. Text the Crisis Text Line at (707) 354-8731. This information is not intended to replace advice given to you by your health care provider. Make sure you discuss any questions you have with your health care provider. Document Revised: 12/24/2021 Document Reviewed: 07/08/2020 Elsevier Patient Education  2024 Elsevier Inc.   If you have been instructed to have an in-person evaluation today at a local Urgent Care facility, please use the link below. It will take you to a list of all of our available [place] [facility], including address, phone number and hours of operation. Please do not delay care.  [facility]  If you or a family member do not have a primary care provider, use the link below to schedule a visit and establish care. When you  choose a [facility] primary care physician or advanced practice provider, you gain a long-term partner in health. Find a Primary Care Provider  Learn more about [facility]'s in-office and virtual care options: [facility] - Get Care Now

## 2023-11-30 ENCOUNTER — Telehealth: Admitting: Physician Assistant

## 2023-11-30 DIAGNOSIS — K0889 Other specified disorders of teeth and supporting structures: Secondary | ICD-10-CM

## 2023-11-30 MED ORDER — PENICILLIN V POTASSIUM 500 MG PO TABS: 500.0000 mg | ORAL_TABLET | Freq: Three times a day (TID) | ORAL | 0 refills | Status: AC

## 2023-11-30 NOTE — Progress Notes (Signed)
 [name], daddario are scheduled for a virtual visit with your provider today.    Just as we do with appointments in the office, we must obtain your consent to participate.  Your consent will be active for this visit and any virtual visit you may have with one of our providers in the next 365 days.    If you have a MyChart account, I can also send a copy of this consent to you electronically.  All virtual visits are billed to your insurance company just like a traditional visit in the office.  As this is a virtual visit, video technology does not allow for your provider to perform a traditional examination.  This may limit your provider's ability to fully assess your condition.  If your provider identifies any concerns that need to be evaluated in person or the need to arrange testing such as labs, EKG, etc, we will make arrangements to do so.    Although advances in technology are sophisticated, we cannot ensure that it will always work on either your end or our end.  If the connection with a video visit is poor, we may have to switch to a telephone visit.  With either a video or telephone visit, we are not always able to ensure that we have a secure connection.   I need to obtain your verbal consent now.   Are you willing to proceed with your visit today?   Brenisha Tsui has provided verbal consent on 11/30/2023 for a virtual visit (video or telephone).   Lynden GORMAN Snuffer, [place] 11/30/2023  6:54 PM   Date:  11/30/2023   ID:  Erika Thomas, DOB [birth year], MRN [id number]  Patient Location: Home Provider Location: Home Office   Participants: Patient and Provider for Visit and Wrap up  Method of visit: Video  Location of Patient: Home Location of Provider: Home Office Consent was obtain for visit over the video. Services rendered by provider: Visit was performed via video  A video enabled telemedicine application was used and I verified that I am speaking with the correct person using two  identifiers.  PCP:  Lorren Greig PARAS, NP   Chief Complaint:  dental pain  History of Present Illness:    Erika Thomas is a 43 y.o. female with history as stated below. Presents video telehealth for an acute care visit  Pt states that she has some cavities that are causing her pain. She started having pain to the bilat upper molars. She tried to follow up with a dentist but she is not able to get an appointment today. She has tried otc medications for her symptoms.   Past Medical, Surgical, Social History, Allergies, and Medications have been Reviewed.  Past Medical History:  Diagnosis Date   Asthma     No outpatient medications have been marked as taking for the 11/30/23 encounter (Video Visit) with [facility] PROVIDER.     Allergies:   Patient has no known allergies.   ROS See HPI for history of present illness.  Physical Exam Constitutional:      Appearance: Normal appearance. She is not ill-appearing.  HENT:     Mouth/Throat:     Comments: Right upper molar with dental carie. Unable to visualize left upper molars. No gum swelling or pain on self exam. No facial swelling. No trismus. No sublingual swelling or submandibular swelling noted.  Neurological:     Mental Status: She is alert.  MDM: Pt with dental pain, likely dental infection. No obvious abscess or deep space infection. Rx for abx was given. Advised otc pain medications.   Tests Ordered: No orders of the defined types were placed in this encounter.   Medication Changes: No orders of the defined types were placed in this encounter.    Disposition:  Follow up  Signed, Lynden GORMAN Snuffer, PA-C  11/30/2023 6:54 PM

## 2023-11-30 NOTE — Patient Instructions (Addendum)
 Erika Thomas, thank you for joining Lynden GORMAN Snuffer, PA-C for today's virtual visit.  While this provider is not your primary care provider (PCP), if your PCP is located in our provider database this encounter information will be shared with them immediately following your visit.   A [facility] MyChart account gives you access to today's visit and all your visits, tests, and labs performed at [facility]  click here if you don't have a [facility] MyChart account or go to [email or web address]  Consent: (Patient) Erika Thomas provided verbal consent for this virtual visit at the beginning of the encounter.  Current Medications:  Current Outpatient Medications:    Acetaminophen  (TYLENOL  EXTRA STRENGTH PO), Take 1,300 mg by mouth every 8 (eight) hours as needed (headache)., Disp: , Rfl:    albuterol  (PROVENTIL ) (2.5 MG/3ML) 0.083% nebulizer solution, Take 3 mLs (2.5 mg total) by nebulization every 2 (two) hours as needed for wheezing or shortness of breath., Disp: 75 mL, Rfl: 12   cetirizine  (ZYRTEC ) 10 MG tablet, Take 1 tablet (10 mg total) by mouth daily., Disp: 30 tablet, Rfl: 11   citalopram  (CELEXA ) 40 MG tablet, Take 1 tablet (40 mg total) by mouth daily., Disp: 60 tablet, Rfl: 0   ferrous sulfate  325 (65 FE) MG tablet, Take 1 tablet (325 mg total) by mouth 2 (two) times daily with a meal., Disp: 60 tablet, Rfl: 3   hydrOXYzine (ATARAX) 25 MG tablet, Take 25 mg by mouth every 6 (six) hours as needed for anxiety., Disp: , Rfl:    metroNIDAZOLE  (FLAGYL ) 500 MG tablet, Take 1 tablet (500 mg total) by mouth 2 (two) times daily., Disp: 14 tablet, Rfl: 0   mometasone -formoterol  (DULERA ) 100-5 MCG/ACT AERO, Inhale 2 puffs into the lungs 2 (two) times daily., Disp: 1 each, Rfl:    olopatadine  (PATADAY ) 0.1 % ophthalmic solution, Place 1 drop into both eyes 2 (two) times daily., Disp: 5 mL, Rfl: 0   trimethoprim -polymyxin b  (POLYTRIM ) ophthalmic solution, Apply 1-2 drops into  affected eye QID x 5 days., Disp: 10 mL, Rfl: 0   Medications ordered in this encounter:  No orders of the defined types were placed in this encounter.    *If you need refills on other medications prior to your next appointment, please contact your pharmacy*  Follow-Up: Call back or seek an in-person evaluation if the symptoms worsen or if the condition fails to improve as anticipated.  [facility] [phone]  Other Instructions You were given a prescription for antibiotics. Please take the antibiotic prescription fully.   Take 650mg  of tylenol  every 6 hours. Take 600mg  of ibupofen every 6 hours.   Please seek an in person visit with a dentist as soon as possible for further evaluation of your symptoms    If you have been instructed to have an in-person evaluation today at a local Urgent Care facility, please use the link below. It will take you to a list of all of our available [facility], including address, phone number and hours of operation. Please do not delay care.  [place] [facility]  If you or a family member do not have a primary care provider, use the link below to schedule a visit and establish care. When you choose a [facility] primary care physician or advanced practice provider, you gain a long-term partner in health. Find a Primary Care Provider  Learn more about [facility]'s in-office and virtual care options:  - Get  Care Now
